# Patient Record
Sex: Male | Born: 1950 | Race: White | Hispanic: No | Smoking: Former smoker
Health system: Southern US, Community
[De-identification: ages and names within clinical notes are randomized; demographics above are authoritative.]

## PROBLEM LIST (undated history)

## (undated) DIAGNOSIS — K5900 Constipation, unspecified: Secondary | ICD-10-CM

## (undated) DIAGNOSIS — K7010 Alcoholic hepatitis without ascites: Secondary | ICD-10-CM

## (undated) DIAGNOSIS — G4700 Insomnia, unspecified: Secondary | ICD-10-CM

## (undated) DIAGNOSIS — G8929 Other chronic pain: Secondary | ICD-10-CM

## (undated) DIAGNOSIS — K219 Gastro-esophageal reflux disease without esophagitis: Secondary | ICD-10-CM

## (undated) DIAGNOSIS — E7800 Pure hypercholesterolemia, unspecified: Secondary | ICD-10-CM

## (undated) DIAGNOSIS — B009 Herpesviral infection, unspecified: Secondary | ICD-10-CM

## (undated) DIAGNOSIS — N400 Enlarged prostate without lower urinary tract symptoms: Secondary | ICD-10-CM

## (undated) DIAGNOSIS — F330 Major depressive disorder, recurrent, mild: Secondary | ICD-10-CM

## (undated) DIAGNOSIS — F32A Depression, unspecified: Secondary | ICD-10-CM

## (undated) DIAGNOSIS — R45851 Suicidal ideations: Secondary | ICD-10-CM

## (undated) DIAGNOSIS — K5790 Diverticulosis of intestine, part unspecified, without perforation or abscess without bleeding: Secondary | ICD-10-CM

## (undated) DIAGNOSIS — R55 Syncope and collapse: Secondary | ICD-10-CM

## (undated) DIAGNOSIS — K449 Diaphragmatic hernia without obstruction or gangrene: Secondary | ICD-10-CM

## (undated) DIAGNOSIS — R7989 Other specified abnormal findings of blood chemistry: Secondary | ICD-10-CM

## (undated) DIAGNOSIS — F419 Anxiety disorder, unspecified: Secondary | ICD-10-CM

## (undated) DIAGNOSIS — R9389 Abnormal findings on diagnostic imaging of other specified body structures: Secondary | ICD-10-CM

## (undated) HISTORY — DX: Insomnia, unspecified: G47.00

## (undated) HISTORY — DX: Diverticulosis of intestine, part unspecified, without perforation or abscess without bleeding: K57.90

## (undated) HISTORY — DX: Herpesviral infection, unspecified: B00.9

## (undated) HISTORY — PX: TONSILLECTOMY: SUR1361

## (undated) HISTORY — DX: Constipation, unspecified: K59.00

## (undated) HISTORY — DX: Major depressive disorder, recurrent, mild: F33.0

## (undated) HISTORY — PX: NISSEN FUNDOPLICATION: SHX2091

## (undated) HISTORY — PX: BACK SURGERY: SHX140

## (undated) HISTORY — DX: Abnormal findings on diagnostic imaging of other specified body structures: R93.89

## (undated) HISTORY — DX: Depression, unspecified: F32.A

## (undated) HISTORY — DX: Alcoholic hepatitis without ascites: K70.10

## (undated) HISTORY — DX: Anxiety disorder, unspecified: F41.9

## (undated) HISTORY — DX: Diaphragmatic hernia without obstruction or gangrene: K44.9

## (undated) HISTORY — DX: Benign prostatic hyperplasia without lower urinary tract symptoms: N40.0

## (undated) HISTORY — PX: LUMBAR DISC SURGERY: SHX700

## (undated) HISTORY — DX: Other chronic pain: G89.29

## (undated) HISTORY — DX: Suicidal ideations: R45.851

---

## 1999-08-21 ENCOUNTER — Encounter: Admission: RE | Admit: 1999-08-21 | Discharge: 1999-08-21 | Payer: Self-pay | Admitting: Family Medicine

## 1999-08-21 ENCOUNTER — Encounter: Payer: Self-pay | Admitting: Family Medicine

## 2010-08-09 ENCOUNTER — Emergency Department (HOSPITAL_COMMUNITY)
Admission: EM | Admit: 2010-08-09 | Discharge: 2010-08-09 | Payer: Self-pay | Source: Home / Self Care | Admitting: Emergency Medicine

## 2010-08-17 ENCOUNTER — Emergency Department (HOSPITAL_COMMUNITY)
Admission: EM | Admit: 2010-08-17 | Discharge: 2010-08-18 | Payer: Self-pay | Source: Home / Self Care | Admitting: Emergency Medicine

## 2010-08-17 LAB — AMMONIA: Ammonia: 20 umol/L (ref 11–35)

## 2010-08-17 LAB — COMPREHENSIVE METABOLIC PANEL
ALT: 39 U/L (ref 0–53)
AST: 35 U/L (ref 0–37)
Albumin: 4.1 g/dL (ref 3.5–5.2)
Alkaline Phosphatase: 131 U/L — ABNORMAL HIGH (ref 39–117)
BUN: 15 mg/dL (ref 6–23)
CO2: 25 mEq/L (ref 19–32)
Calcium: 9.4 mg/dL (ref 8.4–10.5)
Chloride: 103 mEq/L (ref 96–112)
Creatinine, Ser: 1.49 mg/dL (ref 0.4–1.5)
GFR calc Af Amer: 58 mL/min — ABNORMAL LOW (ref >60)
GFR calc non Af Amer: 48 mL/min — ABNORMAL LOW (ref >60)
Glucose, Bld: 155 mg/dL — ABNORMAL HIGH (ref 70–99)
Potassium: 3.7 mEq/L (ref 3.5–5.1)
Sodium: 138 mEq/L (ref 135–145)
Total Bilirubin: 0.6 mg/dL (ref 0.3–1.2)
Total Protein: 7.5 g/dL (ref 6.0–8.3)

## 2010-08-17 LAB — CBC
HCT: 44.3 % (ref 39.0–52.0)
Hemoglobin: 15.6 g/dL (ref 13.0–17.0)
MCH: 31.1 pg (ref 26.0–34.0)
MCHC: 35.2 g/dL (ref 30.0–36.0)
MCV: 88.2 fL (ref 78.0–100.0)
Platelets: 166 10*3/uL (ref 150–400)
RBC: 5.02 MIL/uL (ref 4.22–5.81)
RDW: 13.7 % (ref 11.5–15.5)
WBC: 6.5 10*3/uL (ref 4.0–10.5)

## 2010-08-17 LAB — DIFFERENTIAL
Basophils Absolute: 0 10*3/uL (ref 0.0–0.1)
Basophils Relative: 0 % (ref 0–1)
Eosinophils Absolute: 0 10*3/uL (ref 0.0–0.7)
Eosinophils Relative: 0 % (ref 0–5)
Lymphocytes Relative: 14 % (ref 12–46)
Lymphs Abs: 0.9 10*3/uL (ref 0.7–4.0)
Monocytes Absolute: 0.4 10*3/uL (ref 0.1–1.0)
Monocytes Relative: 6 % (ref 3–12)
Neutro Abs: 5.1 10*3/uL (ref 1.7–7.7)
Neutrophils Relative %: 79 % — ABNORMAL HIGH (ref 43–77)

## 2010-08-17 LAB — GLUCOSE, CAPILLARY: Glucose-Capillary: 159 mg/dL — ABNORMAL HIGH (ref 70–99)

## 2010-08-17 LAB — ETHANOL: Alcohol, Ethyl (B): 100 mg/dL — ABNORMAL HIGH (ref 0–10)

## 2010-08-17 LAB — PROTIME-INR
INR: 1.01 (ref 0.00–1.49)
Prothrombin Time: 13.5 seconds (ref 11.6–15.2)

## 2010-08-18 LAB — RAPID URINE DRUG SCREEN, HOSP PERFORMED
Amphetamines: NOT DETECTED
Barbiturates: NOT DETECTED
Benzodiazepines: NOT DETECTED
Cocaine: NOT DETECTED
Opiates: NOT DETECTED
Tetrahydrocannabinol: POSITIVE — AB

## 2010-10-23 LAB — DIFFERENTIAL
Basophils Absolute: 0 10*3/uL (ref 0.0–0.1)
Basophils Relative: 1 % (ref 0–1)
Eosinophils Absolute: 0.2 10*3/uL (ref 0.0–0.7)
Eosinophils Relative: 3 % (ref 0–5)
Lymphocytes Relative: 18 % (ref 12–46)
Lymphs Abs: 1.1 10*3/uL (ref 0.7–4.0)
Monocytes Absolute: 0.7 10*3/uL (ref 0.1–1.0)
Monocytes Relative: 11 % (ref 3–12)
Neutro Abs: 4.3 10*3/uL (ref 1.7–7.7)
Neutrophils Relative %: 68 % (ref 43–77)

## 2010-10-23 LAB — CBC
HCT: 42.5 % (ref 39.0–52.0)
Hemoglobin: 15.2 g/dL (ref 13.0–17.0)
MCH: 31.1 pg (ref 26.0–34.0)
MCHC: 35.8 g/dL (ref 30.0–36.0)
MCV: 87.1 fL (ref 78.0–100.0)
Platelets: 162 10*3/uL (ref 150–400)
RBC: 4.88 MIL/uL (ref 4.22–5.81)
RDW: 13.4 % (ref 11.5–15.5)
WBC: 6.3 10*3/uL (ref 4.0–10.5)

## 2010-10-23 LAB — POCT I-STAT, CHEM 8
BUN: 26 mg/dL — ABNORMAL HIGH (ref 6–23)
Calcium, Ion: 1.15 mmol/L (ref 1.12–1.32)
Chloride: 106 mEq/L (ref 96–112)
Creatinine, Ser: 1.6 mg/dL — ABNORMAL HIGH (ref 0.4–1.5)
Glucose, Bld: 199 mg/dL — ABNORMAL HIGH (ref 70–99)
HCT: 44 % (ref 39.0–52.0)
Hemoglobin: 15 g/dL (ref 13.0–17.0)
Potassium: 3.9 mEq/L (ref 3.5–5.1)
Sodium: 137 mEq/L (ref 135–145)
TCO2: 22 mmol/L (ref 0–100)

## 2010-10-23 LAB — POCT CARDIAC MARKERS
CKMB, poc: <1 ng/mL — ABNORMAL LOW (ref 1.0–8.0)
CKMB, poc: <1 ng/mL — ABNORMAL LOW (ref 1.0–8.0)
Myoglobin, poc: 63.2 ng/mL (ref 12–200)
Myoglobin, poc: 88.7 ng/mL (ref 12–200)
Troponin i, poc: <0.05 ng/mL (ref 0.00–0.09)
Troponin i, poc: <0.05 ng/mL (ref 0.00–0.09)

## 2010-10-23 LAB — D-DIMER, QUANTITATIVE: D-Dimer, Quant: 0.27 ug/mL-FEU (ref 0.00–0.48)

## 2014-12-18 ENCOUNTER — Encounter (HOSPITAL_COMMUNITY): Payer: Self-pay | Admitting: Emergency Medicine

## 2014-12-18 ENCOUNTER — Emergency Department (HOSPITAL_COMMUNITY)
Admission: EM | Admit: 2014-12-18 | Discharge: 2014-12-18 | Disposition: A | Discharge status: Home / Self Care | Payer: Self-pay | Attending: Emergency Medicine | Admitting: Emergency Medicine

## 2014-12-18 ENCOUNTER — Emergency Department (HOSPITAL_COMMUNITY): Payer: Self-pay

## 2014-12-18 DIAGNOSIS — E780 Pure hypercholesterolemia: Secondary | ICD-10-CM | POA: Insufficient documentation

## 2014-12-18 DIAGNOSIS — Z79899 Other long term (current) drug therapy: Secondary | ICD-10-CM | POA: Insufficient documentation

## 2014-12-18 DIAGNOSIS — R55 Syncope and collapse: Secondary | ICD-10-CM | POA: Insufficient documentation

## 2014-12-18 DIAGNOSIS — Z7982 Long term (current) use of aspirin: Secondary | ICD-10-CM | POA: Insufficient documentation

## 2014-12-18 DIAGNOSIS — K219 Gastro-esophageal reflux disease without esophagitis: Secondary | ICD-10-CM | POA: Insufficient documentation

## 2014-12-18 HISTORY — DX: Pure hypercholesterolemia, unspecified: E78.00

## 2014-12-18 HISTORY — DX: Other specified abnormal findings of blood chemistry: R79.89

## 2014-12-18 HISTORY — DX: Syncope and collapse: R55

## 2014-12-18 HISTORY — DX: Gastro-esophageal reflux disease without esophagitis: K21.9

## 2014-12-18 LAB — I-STAT CHEM 8, ED
BUN: 13 mg/dL (ref 6–20)
Calcium, Ion: 1.16 mmol/L (ref 1.13–1.30)
Chloride: 100 mmol/L — ABNORMAL LOW (ref 101–111)
Creatinine, Ser: 1.2 mg/dL (ref 0.61–1.24)
Glucose, Bld: 105 mg/dL — ABNORMAL HIGH (ref 70–99)
HCT: 43 % (ref 39.0–52.0)
Hemoglobin: 14.6 g/dL (ref 13.0–17.0)
Potassium: 4.3 mmol/L (ref 3.5–5.1)
Sodium: 135 mmol/L (ref 135–145)
TCO2: 20 mmol/L (ref 0–100)

## 2014-12-18 LAB — CBC
HCT: 40.2 % (ref 39.0–52.0)
Hemoglobin: 13.5 g/dL (ref 13.0–17.0)
MCH: 30.4 pg (ref 26.0–34.0)
MCHC: 33.6 g/dL (ref 30.0–36.0)
MCV: 90.5 fL (ref 78.0–100.0)
Platelets: 200 10*3/uL (ref 150–400)
RBC: 4.44 MIL/uL (ref 4.22–5.81)
RDW: 14.1 % (ref 11.5–15.5)
WBC: 7.6 10*3/uL (ref 4.0–10.5)

## 2014-12-18 LAB — I-STAT TROPONIN, ED: Troponin i, poc: 0 ng/mL (ref 0.00–0.08)

## 2014-12-18 NOTE — ED Provider Notes (Signed)
CSN: [id number]     Arrival date & time 12/18/14  1136 History   First MD Initiated Contact with Patient 12/18/14 1145     Chief Complaint  Patient presents with  . Loss of Consciousness     (Consider location/radiation/quality/duration/timing/severity/associated sxs/prior Treatment) HPI Comments: The patient is a 64 year old male, he works as a Music therapistcarpenter, he has had 3 distinct episodes over the last couple of years where he has had a change in his mental status, the first occurred when he was at work, he turned his head to the side to talk to a customer and she reported to him that he appeared to be sleeping and not paying attention to her. He had a workup in the emergency department, he is unsure what tests he had, he was released with no acute diagnosis. The second episode occurred when he was talking to his significant other, she reported that his eyes rolled back, his hands shook for several minutes and then resolve spontaneously. He was not evaluated for this episode, he presents today after having a syncopal event that was reported by customer while he was on the job, he stood up very quickly from a kneeling position, she reports that he fell over and went to the ground passing out, he does not remember this, he does not have a headache, he is persistently lightheaded but states "I smoke a lot of pot". He denies chest pain, palpitations, shortness of breath, headache, blurred vision, numbness, weakness, diarrhea, dysuria. He does drink a lot of water to stay hydrated at work, he does not usually have positional changes causing lightheadedness. He did eat breakfast prior to this occurring this morning. He denies any alcohol or other drugs.  Patient is a 64 y.o. male presenting with syncope. The history is provided by the patient.  Loss of Consciousness   Past Medical History  Diagnosis Date  . Syncope   . High cholesterol   . Low testosterone   . GERD (gastroesophageal reflux disease)     Past Surgical History  Procedure Laterality Date  . Nissen fundoplication     History reviewed. No pertinent family history. History  Substance Use Topics  . Smoking status: Never Smoker   . Smokeless tobacco: Not on file  . Alcohol Use: No    Review of Systems  Cardiovascular: Positive for syncope.  All other systems reviewed and are negative.     Allergies  Review of patient's allergies indicates no known allergies.  Home Medications   Prior to Admission medications   Medication Sig Start Date End Date Taking? Authorizing Provider  aspirin EC 81 MG tablet Take 81 mg by mouth daily.   Yes Historical Provider, MD  b complex vitamins tablet Take 1 tablet by mouth daily.   Yes Historical Provider, MD  B Complex-C (SUPER B COMPLEX PO) Take by mouth.   Yes Historical Provider, MD  Cholecalciferol (VITAMIN D PO) Take 1 capsule by mouth daily.   Yes Historical Provider, MD  cyclobenzaprine (FLEXERIL) 10 MG tablet Take 10 mg by mouth 2 (two) times daily as needed for muscle spasms.   Yes Historical Provider, MD  finasteride (PROSCAR) 5 MG tablet Take 5 mg by mouth daily.   Yes Historical Provider, MD  folic acid (FOLVITE) 1 MG tablet Take 1 mg by mouth daily.   Yes Historical Provider, MD  MAGNESIUM PO Take 1 tablet by mouth daily.   Yes Historical Provider, MD  mirtazapine (REMERON) 30 MG tablet Take 30 mg  by mouth at bedtime.   Yes Historical Provider, MD  omeprazole (PRILOSEC) 20 MG capsule Take 40 mg by mouth daily.   Yes Historical Provider, MD  simvastatin (ZOCOR) 40 MG tablet Take 40 mg by mouth daily.   Yes Historical Provider, MD  valACYclovir (VALTREX) 500 MG tablet Take 500 mg by mouth 2 (two) times daily.   Yes Historical Provider, MD  Venlafaxine HCl 225 MG TB24 Take 225 mg by mouth at bedtime.   Yes Historical Provider, MD   BP 118/67 mmHg  Pulse 72  Temp(Src) 98 F (36.7 C) (Oral)  Resp 16  SpO2 100% Physical Exam  Constitutional: He appears well-developed  and well-nourished. No distress.  HENT:  Head: Normocephalic and atraumatic.  Mouth/Throat: Oropharynx is clear and moist. No oropharyngeal exudate.  Eyes: Conjunctivae and EOM are normal. Pupils are equal, round, and reactive to light. Right eye exhibits no discharge. Left eye exhibits no discharge. No scleral icterus.  Neck: Normal range of motion. Neck supple. No JVD present. No thyromegaly present.  Cardiovascular: Normal rate, regular rhythm, normal heart sounds and intact distal pulses.  Exam reveals no gallop and no friction rub.   No murmur heard. Pulmonary/Chest: Effort normal and breath sounds normal. No respiratory distress. He has no wheezes. He has no rales.  Abdominal: Soft. Bowel sounds are normal. He exhibits no distension and no mass. There is no tenderness.  Musculoskeletal: Normal range of motion. He exhibits no edema or tenderness.  Lymphadenopathy:    He has no cervical adenopathy.  Neurological: He is alert. Coordination normal.  Speech is clear, cranial nerves III through XII are intact, memory is intact, strength is normal in all 4 extremities including grips, sensation is intact to light touch and pinprick in all 4 extremities. Coordination as tested by finger-nose-finger is normal, no limb ataxia. Normal gait, normal reflexes at the patellar tendons bilaterally  Skin: Skin is warm and dry. No rash noted. No erythema.  Psychiatric: He has a normal mood and affect. His behavior is normal.  Nursing note and vitals reviewed.   ED Course  Procedures (including critical care time) Labs Review Labs Reviewed  I-STAT CHEM 8, ED - Abnormal; Notable for the following:    Chloride 100 (*)    Glucose, Bld 105 (*)    All other components within normal limits  CBC  I-STAT TROPOININ, ED    Imaging Review Ct Head Wo Contrast  12/18/2014   CLINICAL DATA:  Syncopal episode today after standing up from a seated position.  EXAM: CT HEAD WITHOUT CONTRAST  TECHNIQUE: Contiguous  axial images were obtained from the base of the skull through the vertex without intravenous contrast.  COMPARISON:  08/17/2010.  FINDINGS: Normal appearing cerebral hemispheres and posterior fossa structures. Normal size and position of the ventricles. No skull fracture, intracranial hemorrhage, mass lesion, CT evidence of acute infarction or paranasal sinus air-fluid levels.  IMPRESSION: Normal examination.   Electronically Signed   By: Beckie SaltsSteven  Reid M.D.   On: 12/18/2014 13:12     EKG Interpretation   Date/Time:  Saturday Dec 18 2014 11:42:00 EDT Ventricular Rate:  79 PR Interval:  147 QRS Duration: 93 QT Interval:  367 QTC Calculation: 421 R Axis:   -10 Text Interpretation:  Sinus rhythm Normal ECG since last tracing no  significant change Confirmed by Jobani Sabado  MD, Nicolis Boody ([id number]) on 12/18/2014  11:45:46 AM      MDM   Final diagnoses:  Syncope    The patient's  exam at this time is totally normal as is his EKG, his positional change suggests an orthostatic change, I am somewhat concerned that the first 2 episodes may have been more seizure-like activity though today's did not sound the same. I will obtain a CT scan of the brain, labs to rule out hyponatremia, electrolyte abnormalities, dehydration, anemia, reevaluate. At this time the patient appears at his baseline and has no complaints.  CT and labs normal - VS normal - pt stable for d/c, recommended outpt neuro f/u.    Eber Hong, MD 12/18/14 [phone]

## 2014-12-18 NOTE — ED Notes (Signed)
Attempted to obtain orthostatic vitals, blood pressure on monitor not working at this time

## 2014-12-18 NOTE — ED Notes (Signed)
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  Pt states that he got up from a seated position, turned to walk out the door and does not remember what happened next.  Pt states that this has happened before but did not go to the doctor.  Denies any head injury.  Pt is A&O x 4.

## 2014-12-18 NOTE — Discharge Instructions (Signed)
Your testing has been normal - you may follow up with your doctor for recheck as needed - you should follow up with a neurologist for further evaluation if you have anymore of these abnormal fainting or shaking spells.  See the follow up information above.  Please call your doctor for a followup appointment within 24-48 hours. When you talk to your doctor please let them know that you were seen in the emergency department and have them acquire all of your records so that they can discuss the findings with you and formulate a treatment plan to fully care for your new and ongoing problems.

## 2016-02-16 ENCOUNTER — Institutional Professional Consult (permissible substitution): Payer: Non-veteran care | Admitting: Pulmonary Disease

## 2016-04-20 ENCOUNTER — Institutional Professional Consult (permissible substitution): Payer: Non-veteran care | Admitting: Pulmonary Disease

## 2017-11-27 DIAGNOSIS — N644 Mastodynia: Secondary | ICD-10-CM | POA: Diagnosis not present

## 2018-08-04 DIAGNOSIS — Z570 Occupational exposure to noise: Secondary | ICD-10-CM | POA: Diagnosis not present

## 2018-08-04 DIAGNOSIS — H903 Sensorineural hearing loss, bilateral: Secondary | ICD-10-CM | POA: Diagnosis not present

## 2018-08-04 DIAGNOSIS — H9313 Tinnitus, bilateral: Secondary | ICD-10-CM | POA: Diagnosis not present

## 2018-08-04 DIAGNOSIS — Z822 Family history of deafness and hearing loss: Secondary | ICD-10-CM | POA: Diagnosis not present

## 2019-03-06 DIAGNOSIS — H2513 Age-related nuclear cataract, bilateral: Secondary | ICD-10-CM | POA: Diagnosis not present

## 2019-03-06 DIAGNOSIS — H3589 Other specified retinal disorders: Secondary | ICD-10-CM | POA: Diagnosis not present

## 2019-04-10 DIAGNOSIS — H2511 Age-related nuclear cataract, right eye: Secondary | ICD-10-CM | POA: Diagnosis not present

## 2019-05-01 DIAGNOSIS — H25811 Combined forms of age-related cataract, right eye: Secondary | ICD-10-CM | POA: Diagnosis not present

## 2019-05-01 DIAGNOSIS — H2511 Age-related nuclear cataract, right eye: Secondary | ICD-10-CM | POA: Diagnosis not present

## 2019-05-08 DIAGNOSIS — H35351 Cystoid macular degeneration, right eye: Secondary | ICD-10-CM | POA: Diagnosis not present

## 2019-10-18 ENCOUNTER — Ambulatory Visit: Payer: Medicare HMO | Attending: Internal Medicine

## 2019-10-18 DIAGNOSIS — Z23 Encounter for immunization: Secondary | ICD-10-CM | POA: Insufficient documentation

## 2019-10-18 NOTE — Progress Notes (Signed)
   Covid-19 Vaccination Clinic  Name:  Rohaan Durnil    MRN: [id number] DOB: [birth year]  10/18/2019  Mr. Prust was observed post Covid-19 immunization for 15 minutes without incident. He was provided with Vaccine Information Sheet and instruction to access the V-Safe system.   Mr. Auxier was instructed to call 911 with any severe reactions post vaccine: Marland Kitchen Difficulty breathing  . Swelling of face and throat  . A fast heartbeat  . A bad rash all over body  . Dizziness and weakness   Immunizations Administered    Name Date Dose VIS Date Route   Pfizer COVID-19 Vaccine 10/18/2019  8:07 AM 0.3 mL 07/24/2019 Intramuscular   Manufacturer: [employer], [employer]   Lot: [id number]   NDC: [address]-8

## 2019-11-17 ENCOUNTER — Ambulatory Visit: Payer: Non-veteran care | Attending: Internal Medicine

## 2019-11-17 DIAGNOSIS — Z23 Encounter for immunization: Secondary | ICD-10-CM

## 2019-11-17 NOTE — Progress Notes (Signed)
   Covid-19 Vaccination Clinic  Name:  Martin Suarez    MRN: [id number] DOB: [birth year]  11/17/2019  [name] was observed post Covid-19 immunization for 15 minutes without incident. He was provided with Vaccine Information Sheet and instruction to access the V-Safe system.   [name] was instructed to call 911 with any severe reactions post vaccine: Marland Kitchen Difficulty breathing  . Swelling of face and throat  . A fast heartbeat  . A bad rash all over body  . Dizziness and weakness   Immunizations Administered    Name Date Dose VIS Date Route   Pfizer COVID-19 Vaccine 11/17/2019 10:12 AM 0.3 mL 07/24/2019 Intramuscular   Manufacturer: [employer], [employer]   Lot: [id number]   NDC: [address]-2

## 2020-01-13 ENCOUNTER — Encounter (HOSPITAL_COMMUNITY): Payer: Self-pay | Admitting: Emergency Medicine

## 2020-01-13 ENCOUNTER — Other Ambulatory Visit: Payer: Self-pay

## 2020-01-13 ENCOUNTER — Inpatient Hospital Stay (HOSPITAL_COMMUNITY)
Admission: EM | Admit: 2020-01-13 | Discharge: 2020-01-18 | DRG: 948 | Disposition: A | Discharge status: Home / Self Care | Payer: No Typology Code available for payment source | Attending: Neurosurgery | Admitting: Neurosurgery

## 2020-01-13 DIAGNOSIS — M79605 Pain in left leg: Secondary | ICD-10-CM | POA: Diagnosis present

## 2020-01-13 DIAGNOSIS — R29898 Other symptoms and signs involving the musculoskeletal system: Secondary | ICD-10-CM

## 2020-01-13 DIAGNOSIS — Z888 Allergy status to other drugs, medicaments and biological substances status: Secondary | ICD-10-CM | POA: Diagnosis not present

## 2020-01-13 DIAGNOSIS — E7800 Pure hypercholesterolemia, unspecified: Secondary | ICD-10-CM | POA: Diagnosis present

## 2020-01-13 DIAGNOSIS — R55 Syncope and collapse: Secondary | ICD-10-CM | POA: Diagnosis present

## 2020-01-13 DIAGNOSIS — M79604 Pain in right leg: Secondary | ICD-10-CM | POA: Diagnosis present

## 2020-01-13 DIAGNOSIS — K219 Gastro-esophageal reflux disease without esophagitis: Secondary | ICD-10-CM | POA: Diagnosis present

## 2020-01-13 DIAGNOSIS — M5126 Other intervertebral disc displacement, lumbar region: Secondary | ICD-10-CM | POA: Diagnosis present

## 2020-01-13 DIAGNOSIS — Z20822 Contact with and (suspected) exposure to covid-19: Secondary | ICD-10-CM | POA: Diagnosis present

## 2020-01-13 DIAGNOSIS — M79606 Pain in leg, unspecified: Secondary | ICD-10-CM | POA: Diagnosis present

## 2020-01-13 DIAGNOSIS — Z7982 Long term (current) use of aspirin: Secondary | ICD-10-CM | POA: Diagnosis not present

## 2020-01-13 DIAGNOSIS — Z79899 Other long term (current) drug therapy: Secondary | ICD-10-CM | POA: Diagnosis not present

## 2020-01-13 DIAGNOSIS — G8918 Other acute postprocedural pain: Secondary | ICD-10-CM | POA: Diagnosis not present

## 2020-01-13 DIAGNOSIS — R531 Weakness: Secondary | ICD-10-CM | POA: Diagnosis present

## 2020-01-13 LAB — CBC WITH DIFFERENTIAL/PLATELET
Abs Immature Granulocytes: 0.13 10*3/uL — ABNORMAL HIGH (ref 0.00–0.07)
Abs Immature Granulocytes: 0.11 10*3/uL — ABNORMAL HIGH (ref 0.00–0.07)
Basophils Absolute: 0 10*3/uL (ref 0.0–0.1)
Basophils Absolute: 0 10*3/uL (ref 0.0–0.1)
Basophils Relative: 0 %
Basophils Relative: 0 %
Eosinophils Absolute: 0.1 10*3/uL (ref 0.0–0.5)
Eosinophils Absolute: 0.1 10*3/uL (ref 0.0–0.5)
Eosinophils Relative: 0 %
Eosinophils Relative: 1 %
HCT: 39.3 % (ref 39.0–52.0)
HCT: 35.4 % — ABNORMAL LOW (ref 39.0–52.0)
Hemoglobin: 13.2 g/dL (ref 13.0–17.0)
Hemoglobin: 11.8 g/dL — ABNORMAL LOW (ref 13.0–17.0)
Immature Granulocytes: 1 %
Immature Granulocytes: 1 %
Lymphocytes Relative: 12 %
Lymphocytes Relative: 15 %
Lymphs Abs: 2.1 10*3/uL (ref 0.7–4.0)
Lymphs Abs: 2.2 10*3/uL (ref 0.7–4.0)
MCH: 32.6 pg (ref 26.0–34.0)
MCH: 32.5 pg (ref 26.0–34.0)
MCHC: 33.6 g/dL (ref 30.0–36.0)
MCHC: 33.3 g/dL (ref 30.0–36.0)
MCV: 97 fL (ref 80.0–100.0)
MCV: 97.5 fL (ref 80.0–100.0)
Monocytes Absolute: 1 10*3/uL (ref 0.1–1.0)
Monocytes Absolute: 0.8 10*3/uL (ref 0.1–1.0)
Monocytes Relative: 5 %
Monocytes Relative: 6 %
Neutro Abs: 14.6 10*3/uL — ABNORMAL HIGH (ref 1.7–7.7)
Neutro Abs: 11.4 10*3/uL — ABNORMAL HIGH (ref 1.7–7.7)
Neutrophils Relative %: 82 %
Neutrophils Relative %: 77 %
Platelets: 436 10*3/uL — ABNORMAL HIGH (ref 150–400)
Platelets: 392 10*3/uL (ref 150–400)
RBC: 4.05 MIL/uL — ABNORMAL LOW (ref 4.22–5.81)
RBC: 3.63 MIL/uL — ABNORMAL LOW (ref 4.22–5.81)
RDW: 12.4 % (ref 11.5–15.5)
RDW: 12.4 % (ref 11.5–15.5)
WBC: 18 10*3/uL — ABNORMAL HIGH (ref 4.0–10.5)
WBC: 14.7 10*3/uL — ABNORMAL HIGH (ref 4.0–10.5)
nRBC: 0 % (ref 0.0–0.2)
nRBC: 0 % (ref 0.0–0.2)

## 2020-01-13 LAB — COMPREHENSIVE METABOLIC PANEL
ALT: 61 U/L — ABNORMAL HIGH (ref 0–44)
AST: 30 U/L (ref 15–41)
Albumin: 3.9 g/dL (ref 3.5–5.0)
Alkaline Phosphatase: 93 U/L (ref 38–126)
Anion gap: 13 (ref 5–15)
BUN: 21 mg/dL (ref 8–23)
CO2: 23 mmol/L (ref 22–32)
Calcium: 9.5 mg/dL (ref 8.9–10.3)
Chloride: 96 mmol/L — ABNORMAL LOW (ref 98–111)
Creatinine, Ser: 1.25 mg/dL — ABNORMAL HIGH (ref 0.61–1.24)
GFR calc Af Amer: >60 mL/min (ref >60)
GFR calc non Af Amer: 58 mL/min — ABNORMAL LOW (ref >60)
Glucose, Bld: 127 mg/dL — ABNORMAL HIGH (ref 70–99)
Potassium: 4 mmol/L (ref 3.5–5.1)
Sodium: 132 mmol/L — ABNORMAL LOW (ref 135–145)
Total Bilirubin: 0.8 mg/dL (ref 0.3–1.2)
Total Protein: 7.3 g/dL (ref 6.5–8.1)

## 2020-01-13 LAB — CREATININE, SERUM
Creatinine, Ser: 1.14 mg/dL (ref 0.61–1.24)
GFR calc Af Amer: >60 mL/min (ref >60)
GFR calc non Af Amer: >60 mL/min (ref >60)

## 2020-01-13 LAB — PROTIME-INR
INR: 1.1 (ref 0.8–1.2)
Prothrombin Time: 13.7 seconds (ref 11.4–15.2)

## 2020-01-13 LAB — URINALYSIS, ROUTINE W REFLEX MICROSCOPIC
Bilirubin Urine: NEGATIVE
Glucose, UA: NEGATIVE mg/dL
Ketones, ur: NEGATIVE mg/dL
Nitrite: NEGATIVE
Protein, ur: NEGATIVE mg/dL
Specific Gravity, Urine: 1.008 (ref 1.005–1.030)
WBC, UA: >50 WBC/hpf — ABNORMAL HIGH (ref 0–5)
pH: 6 (ref 5.0–8.0)

## 2020-01-13 LAB — HIV ANTIBODY (ROUTINE TESTING W REFLEX): HIV Screen 4th Generation wRfx: NONREACTIVE

## 2020-01-13 LAB — SARS CORONAVIRUS 2 BY RT PCR (HOSPITAL ORDER, PERFORMED IN ~~LOC~~ HOSPITAL LAB): SARS Coronavirus 2: NEGATIVE

## 2020-01-13 LAB — APTT: aPTT: 28 seconds (ref 24–36)

## 2020-01-13 MED ORDER — FOLIC ACID 1 MG PO TABS
1.0000 mg | ORAL_TABLET | Freq: Every day | ORAL | Status: DC
  Administered 2020-01-14 – 2020-01-18 (×5): 1 mg via ORAL
  Filled 2020-01-13 (×5): qty 1

## 2020-01-13 MED ORDER — SODIUM CHLORIDE 0.9 % IV SOLN: 250.0000 mL | INTRAVENOUS | Status: DC | PRN

## 2020-01-13 MED ORDER — ASPIRIN EC 81 MG PO TBEC
81.0000 mg | DELAYED_RELEASE_TABLET | Freq: Every day | ORAL | Status: DC
  Administered 2020-01-14 – 2020-01-15 (×2): 81 mg via ORAL
  Filled 2020-01-13 (×5): qty 1

## 2020-01-13 MED ORDER — FINASTERIDE 5 MG PO TABS
5.0000 mg | ORAL_TABLET | Freq: Every day | ORAL | Status: DC
  Administered 2020-01-13 – 2020-01-18 (×6): 5 mg via ORAL
  Filled 2020-01-13 (×6): qty 1

## 2020-01-13 MED ORDER — POTASSIUM CHLORIDE IN NACL 20-0.9 MEQ/L-% IV SOLN
INTRAVENOUS | Status: DC
  Filled 2020-01-13 (×3): qty 1000

## 2020-01-13 MED ORDER — VENLAFAXINE HCL ER 75 MG PO CP24
225.0000 mg | ORAL_CAPSULE | Freq: Every day | ORAL | Status: DC
  Administered 2020-01-13 – 2020-01-17 (×5): 225 mg via ORAL
  Filled 2020-01-13 (×5): qty 3

## 2020-01-13 MED ORDER — ACETAMINOPHEN 650 MG RE SUPP: 650.0000 mg | Freq: Four times a day (QID) | RECTAL | Status: DC | PRN

## 2020-01-13 MED ORDER — PANTOPRAZOLE SODIUM 40 MG PO TBEC
40.0000 mg | DELAYED_RELEASE_TABLET | Freq: Every day | ORAL | Status: DC
  Administered 2020-01-13 – 2020-01-18 (×6): 40 mg via ORAL
  Filled 2020-01-13 (×6): qty 1

## 2020-01-13 MED ORDER — HYDROMORPHONE HCL 1 MG/ML IJ SOLN
1.0000 mg | Freq: Once | INTRAMUSCULAR | Status: AC
  Administered 2020-01-13: 1 mg via INTRAVENOUS
  Filled 2020-01-13: qty 1

## 2020-01-13 MED ORDER — OXYCODONE HCL 5 MG PO TABS
5.0000 mg | ORAL_TABLET | ORAL | Status: DC | PRN
  Administered 2020-01-14 (×3): 10 mg via ORAL
  Filled 2020-01-13 (×3): qty 2

## 2020-01-13 MED ORDER — SIMVASTATIN 20 MG PO TABS
40.0000 mg | ORAL_TABLET | Freq: Every day | ORAL | Status: DC
  Administered 2020-01-13 – 2020-01-18 (×6): 40 mg via ORAL
  Filled 2020-01-13 (×6): qty 2

## 2020-01-13 MED ORDER — DIAZEPAM 2 MG PO TABS
5.0000 mg | ORAL_TABLET | Freq: Four times a day (QID) | ORAL | Status: DC | PRN
  Administered 2020-01-13: 5 mg via ORAL
  Filled 2020-01-13: qty 3

## 2020-01-13 MED ORDER — ASPIRIN EC 81 MG PO TBEC: 81.0000 mg | DELAYED_RELEASE_TABLET | Freq: Every day | ORAL | Status: DC

## 2020-01-13 MED ORDER — OXYCODONE HCL ER 15 MG PO T12A
15.0000 mg | EXTENDED_RELEASE_TABLET | Freq: Two times a day (BID) | ORAL | Status: DC
  Administered 2020-01-13 – 2020-01-18 (×11): 15 mg via ORAL
  Filled 2020-01-13 (×11): qty 1

## 2020-01-13 MED ORDER — SODIUM CHLORIDE 0.9% FLUSH
3.0000 mL | Freq: Two times a day (BID) | INTRAVENOUS | Status: DC
  Administered 2020-01-14 – 2020-01-17 (×7): 3 mL via INTRAVENOUS

## 2020-01-13 MED ORDER — ONDANSETRON HCL 4 MG PO TABS: 4.0000 mg | ORAL_TABLET | Freq: Four times a day (QID) | ORAL | Status: DC | PRN

## 2020-01-13 MED ORDER — ACETAMINOPHEN 325 MG PO TABS: 650.0000 mg | ORAL_TABLET | Freq: Four times a day (QID) | ORAL | Status: DC | PRN

## 2020-01-13 MED ORDER — MAGNESIUM 200 MG PO TABS: 1.0000 | ORAL_TABLET | Freq: Every day | ORAL | Status: DC

## 2020-01-13 MED ORDER — MIRTAZAPINE 15 MG PO TABS
30.0000 mg | ORAL_TABLET | Freq: Every day | ORAL | Status: DC
  Administered 2020-01-13 – 2020-01-17 (×5): 30 mg via ORAL
  Filled 2020-01-13 (×5): qty 2

## 2020-01-13 MED ORDER — B COMPLEX-C PO TABS
1.0000 | ORAL_TABLET | Freq: Every day | ORAL | Status: DC
  Administered 2020-01-14 – 2020-01-18 (×5): 1 via ORAL
  Filled 2020-01-13 (×5): qty 1

## 2020-01-13 MED ORDER — CHOLECALCIFEROL 10 MCG (400 UNIT) PO TABS
400.0000 [IU] | ORAL_TABLET | Freq: Every day | ORAL | Status: DC
  Administered 2020-01-14 – 2020-01-18 (×5): 400 [IU] via ORAL
  Filled 2020-01-13 (×5): qty 1

## 2020-01-13 MED ORDER — ONDANSETRON HCL 4 MG/2ML IJ SOLN: 4.0000 mg | Freq: Four times a day (QID) | INTRAMUSCULAR | Status: DC | PRN

## 2020-01-13 MED ORDER — HEPARIN SODIUM (PORCINE) 5000 UNIT/ML IJ SOLN
5000.0000 [IU] | Freq: Three times a day (TID) | INTRAMUSCULAR | Status: DC
  Administered 2020-01-13 – 2020-01-18 (×12): 5000 [IU] via SUBCUTANEOUS
  Filled 2020-01-13 (×15): qty 1

## 2020-01-13 MED ORDER — VALACYCLOVIR HCL 500 MG PO TABS
500.0000 mg | ORAL_TABLET | Freq: Two times a day (BID) | ORAL | Status: DC
  Administered 2020-01-13 – 2020-01-18 (×10): 500 mg via ORAL
  Filled 2020-01-13 (×11): qty 1

## 2020-01-13 MED ORDER — SODIUM CHLORIDE 0.9 % IV BOLUS
1000.0000 mL | Freq: Once | INTRAVENOUS | Status: AC
  Administered 2020-01-13: 1000 mL via INTRAVENOUS

## 2020-01-13 MED ORDER — SODIUM CHLORIDE 0.9% FLUSH: 3.0000 mL | INTRAVENOUS | Status: DC | PRN

## 2020-01-13 NOTE — ED Notes (Signed)
Pt states he has fallen 4x since surgery on the 25th. States he lives alone and needs assistance w/ walking at home. Per pt pain was so bad this morning, he took 15mg  oxycodone and tried to call insurance company about getting home health, then he "melted to the floor", denies hitting head or LOC.

## 2020-01-13 NOTE — H&P (Signed)
BP 132/83   Pulse 88   Temp 98.5 F (36.9 C) (Oral)   Resp (!) 27   SpO2 98%  Martin Suarez is well known to the practice just having had a three level lumbar discetomy on 5/25 for a left L3/4 laminectomy for disc, bilateral l4/5 discetomies, and a right L5/1laminectomy for disc herniation. Post op he did well immediately. However he call our office yesterday complaining of severe pain. He states he has fallen 4 times between yesterday and today. He comes to the ED due to difficulty walking and perceived weakness in the right lower extremity(hamstrings). He is taking pain medication with some minor relief. Bowel and bladder continence is fine.  No Known Allergies Past Medical History:  Diagnosis Date  . GERD (gastroesophageal reflux disease)   . High cholesterol   . Low testosterone   . Syncope    Past Surgical History:  Procedure Laterality Date  . BACK SURGERY    . NISSEN FUNDOPLICATION     No family history on file. Social History   Socioeconomic History  . Marital status: Divorced    Spouse name: Not on file  . Number of children: Not on file  . Years of education: Not on file  . Highest education level: Not on file  Occupational History  . Not on file  Tobacco Use  . Smoking status: Never Smoker  Substance and Sexual Activity  . Alcohol use: No  . Drug use: Yes    Types: Marijuana  . Sexual activity: Not on file  Other Topics Concern  . Not on file  Social History Narrative  . Not on file   Social Determinants of Health   Financial Resource Strain:   . Difficulty of Paying Living Expenses:   Food Insecurity:   . Worried About Programme researcher, broadcasting/film/video in the Last Year:   . Barista in the Last Year:   Transportation Needs:   . Freight forwarder (Medical):   Marland Kitchen Lack of Transportation (Non-Medical):   Physical Activity:   . Days of Exercise per Week:   . Minutes of Exercise per Session:   Stress:   . Feeling of Stress :   Social Connections:   . Frequency  of Communication with Friends and Family:   . Frequency of Social Gatherings with Friends and Family:   . Attends Religious Services:   . Active Member of Clubs or Organizations:   . Attends Banker Meetings:   Marland Kitchen Marital Status:   Intimate Partner Violence:   . Fear of Current or Ex-Partner:   . Emotionally Abused:   Marland Kitchen Physically Abused:   . Sexually Abused:    Physical Exam Constitutional:      General: He is in acute distress.     Appearance: Normal appearance.  HENT:     Head: Normocephalic and atraumatic.     Right Ear: Tympanic membrane normal.     Left Ear: Tympanic membrane normal.     Nose: Nose normal.     Mouth/Throat:     Mouth: Mucous membranes are moist.     Pharynx: Oropharynx is clear.  Eyes:     Extraocular Movements: Extraocular movements intact.     Conjunctiva/sclera: Conjunctivae normal.     Pupils: Pupils are equal, round, and reactive to light.  Cardiovascular:     Rate and Rhythm: Normal rate and regular rhythm.  Pulmonary:     Effort: Pulmonary effort is normal.  Breath sounds: Normal breath sounds.  Abdominal:     General: Abdomen is flat.     Palpations: Abdomen is soft.  Musculoskeletal:        General: Normal range of motion.     Cervical back: Normal range of motion.  Neurological:     Mental Status: He is alert and oriented to person, place, and time.     Cranial Nerves: Cranial nerve deficit present.     Sensory: Sensation is intact.     Motor: Motor function is intact.     Coordination: Coordination is intact.     Deep Tendon Reflexes:     Reflex Scores:      Tricep reflexes are 2+ on the right side and 2+ on the left side.      Bicep reflexes are 2+ on the right side and 2+ on the left side.      Brachioradialis reflexes are 2+ on the right side and 2+ on the left side.      Patellar reflexes are 2+ on the right side and 2+ on the left side.      Achilles reflexes are 0 on the right side and 0 on the left side.     Comments: Decreased hearing bilaterally, right > left Slight weakness in right hamstrings No clonus, normal proprioception Did not assess gait    Admit for difficulty with gait and multiple falls status post lumbar discetomies. At this time he has too much pain to undergo a good mri.

## 2020-01-13 NOTE — ED Provider Notes (Signed)
[facility] EMERGENCY DEPARTMENT Provider Note   CSN: [id number] Arrival date & time: 01/13/20  1120     History Chief Complaint  Patient presents with  . Leg Pain    Martin Suarez is a 69 y.o. male.  HPI 69 year old male presents with bilateral leg pain and weakness.  On May 25 he had lower back surgery.  He had 2 herniated disks that has been causing sciatic type pain.  He states his back feels great but he is having intractable bilateral leg pain from his buttocks down to his ankles.  This is more intense than before surgery.  Surgery by Dr. Jordan Likes.  Has been taking Percocet, muscle relaxer and Medrol Dosepak without relief.  Instead of 2 Percocet he took 3 this morning which did help.  He has fallen 4 times due to his legs giving out.  No injuries from these.  Called his neurosurgery team to see if he can get some help with home health at home but they recommended they cannot do this from their office.  He denies any other complaints such as fever, urinary/bowel incontinence, numbness to his legs, or vomiting/diarrhea.   Past Medical History:  Diagnosis Date  . GERD (gastroesophageal reflux disease)   . High cholesterol   . Low testosterone   . Syncope     Patient Active Problem List   Diagnosis Date Noted  . HNP (herniated nucleus pulposus), lumbar 01/13/2020    Past Surgical History:  Procedure Laterality Date  . BACK SURGERY    . NISSEN FUNDOPLICATION         No family history on file.  Social History   Tobacco Use  . Smoking status: Never Smoker  Substance Use Topics  . Alcohol use: No  . Drug use: Yes    Types: Marijuana    Home Medications Prior to Admission medications   Medication Sig Start Date End Date Taking? Authorizing Provider  aspirin EC 81 MG tablet Take 81 mg by mouth daily.    [provider]  b complex vitamins tablet Take 1 tablet by mouth daily.    [provider]  B Complex-C (SUPER B COMPLEX PO) Take by  mouth.    [provider]  Cholecalciferol (VITAMIN D PO) Take 1 capsule by mouth daily.    [provider]  cyclobenzaprine (FLEXERIL) 10 MG tablet Take 10 mg by mouth 2 (two) times daily as needed for muscle spasms.    [provider]  finasteride (PROSCAR) 5 MG tablet Take 5 mg by mouth daily.    [provider]  folic acid (FOLVITE) 1 MG tablet Take 1 mg by mouth daily.    [provider]  MAGNESIUM PO Take 1 tablet by mouth daily.    [provider]  mirtazapine (REMERON) 30 MG tablet Take 30 mg by mouth at bedtime.    [provider]  omeprazole (PRILOSEC) 20 MG capsule Take 40 mg by mouth daily.    [provider]  simvastatin (ZOCOR) 40 MG tablet Take 40 mg by mouth daily.    [provider]  valACYclovir (VALTREX) 500 MG tablet Take 500 mg by mouth 2 (two) times daily.    [provider]  Venlafaxine HCl 225 MG TB24 Take 225 mg by mouth at bedtime.    [provider]    Allergies    Patient has no known allergies.  Review of Systems   Review of Systems  Constitutional: Negative for  fever.  Gastrointestinal: Negative for abdominal pain, diarrhea and vomiting.  Genitourinary: Negative for dysuria.       No incontinence  Musculoskeletal: Negative for back pain.  Neurological: Positive for weakness. Negative for numbness.  All other systems reviewed and are negative.   Physical Exam Updated Vital Signs BP (!) 135/92   Pulse 88   Temp 98.5 F (36.9 C) (Oral)   Resp 20   SpO2 98%   Physical Exam Vitals and nursing note reviewed.  Constitutional:      General: He is not in acute distress.    Appearance: He is well-developed. He is not ill-appearing or diaphoretic.  HENT:     Head: Normocephalic and atraumatic.     Right Ear: External ear normal.     Left Ear: External ear normal.     Nose: Nose normal.  Eyes:     General:        Right eye: No discharge.        Left  eye: No discharge.  Cardiovascular:     Rate and Rhythm: Normal rate and regular rhythm.     Heart sounds: Normal heart sounds.  Pulmonary:     Effort: Pulmonary effort is normal.     Breath sounds: Normal breath sounds.  Abdominal:     Palpations: Abdomen is soft.     Tenderness: There is no abdominal tenderness.  Musculoskeletal:     Cervical back: Neck supple.     Lumbar back: No tenderness.       Back:  Skin:    General: Skin is warm and dry.  Neurological:     Mental Status: He is alert.     Deep Tendon Reflexes:     Reflex Scores:      Patellar reflexes are 2+ on the right side and 2+ on the left side.      Achilles reflexes are 2+ on the right side and 2+ on the left side.    Comments: 5/5 strength in BLE. Normal gross sensation. Is able to ambulate but is painful  Psychiatric:        Mood and Affect: Mood is not anxious.     ED Results / Procedures / Treatments   Labs (all labs ordered are listed, but only abnormal results are displayed) Labs Reviewed  COMPREHENSIVE METABOLIC PANEL - Abnormal; Notable for the following components:      Result Value   Sodium 132 (*)    Chloride 96 (*)    Glucose, Bld 127 (*)    Creatinine, Ser 1.25 (*)    ALT 61 (*)    GFR calc non Af Amer 58 (*)    All other components within normal limits  CBC WITH DIFFERENTIAL/PLATELET - Abnormal; Notable for the following components:   WBC 18.0 (*)    RBC 4.05 (*)    Platelets 436 (*)    Neutro Abs 14.6 (*)    Abs Immature Granulocytes 0.13 (*)    All other components within normal limits  CBC WITH DIFFERENTIAL/PLATELET - Abnormal; Notable for the following components:   WBC 14.7 (*)    RBC 3.63 (*)    Hemoglobin 11.8 (*)    HCT 35.4 (*)    Neutro Abs 11.4 (*)    Abs Immature Granulocytes 0.11 (*)    All other components within normal limits  SARS CORONAVIRUS 2 BY RT PCR ([facility])  APTT  PROTIME-INR  CREATININE, SERUM  URINALYSIS,  ROUTINE W REFLEX MICROSCOPIC  HIV ANTIBODY (ROUTINE TESTING W REFLEX)  CBC    EKG EKG Interpretation  Date/Time:  Wednesday January 13 2020 13:04:47 EDT Ventricular Rate:  86 PR Interval:    QRS Duration: 114 QT Interval:  376 QTC Calculation: 450 R Axis:   -9 Text Interpretation: Sinus rhythm Incomplete right bundle branch block Confirmed by Pricilla Loveless [phone]) on 01/13/2020 1:15:32 PM   Radiology No results found.  Procedures Procedures (including critical care time)  Medications Ordered in ED Medications  aspirin EC tablet 81 mg (has no administration in time range)  valACYclovir (VALTREX) tablet 500 mg (has no administration in time range)  simvastatin (ZOCOR) tablet 40 mg (has no administration in time range)  mirtazapine (REMERON) tablet 30 mg (has no administration in time range)  Venlafaxine HCl TB24 225 mg (has no administration in time range)  pantoprazole (PROTONIX) EC tablet 40 mg (has no administration in time range)  finasteride (PROSCAR) tablet 5 mg (has no administration in time range)  folic acid (FOLVITE) tablet 1 mg (has no administration in time range)  b complex vitamins tablet 1 tablet (has no administration in time range)  Magnesium TABS 200 mg (has no administration in time range)  cholecalciferol (VITAMIN D3) tablet 400 Units (has no administration in time range)  oxyCODONE (Oxy IR/ROXICODONE) immediate release tablet 5-10 mg (has no administration in time range)  oxyCODONE (OXYCONTIN) 12 hr tablet 15 mg (has no administration in time range)  0.9 % NaCl with KCl 20 mEq/ L  infusion (has no administration in time range)  sodium chloride flush (NS) 0.9 % injection 3 mL (3 mLs Intravenous Not Given 01/13/20 1609)  sodium chloride flush (NS) 0.9 % injection 3 mL (has no administration in time range)  0.9 %  sodium chloride infusion (has no administration in time range)  ondansetron (ZOFRAN) tablet 4 mg (has no administration in time range)    Or   ondansetron (ZOFRAN) injection 4 mg (has no administration in time range)  heparin injection 5,000 Units (has no administration in time range)  acetaminophen (TYLENOL) tablet 650 mg (has no administration in time range)    Or  acetaminophen (TYLENOL) suppository 650 mg (has no administration in time range)  diazepam (VALIUM) tablet 5 mg (has no administration in time range)  sodium chloride 0.9 % bolus 1,000 mL (0 mLs Intravenous Stopped 01/13/20 1425)  HYDROmorphone (DILAUDID) injection 1 mg (1 mg Intravenous Given 01/13/20 1321)    ED Course  I have reviewed the triage vital signs and the nursing notes.  Pertinent labs & imaging results that were available during my care of the patient were reviewed by me and considered in my medical decision making (see chart for details).    MDM Rules/Calculators/A&P                      Patient's wound looks good.  His labs do show a leukocytosis but this is probably from being on steroids.  No appreciable weakness right now.  A lot of it seems to be a pain control issue.  Neurosurgery consulted and Dr. Franky Macho will admit for pain control and further work-up. Final Clinical Impression(s) / ED Diagnoses Final diagnoses:  Weakness of both lower extremities    Rx / DC Orders ED Discharge Orders    None       Pricilla Loveless, MD 01/13/20 1630

## 2020-01-13 NOTE — ED Triage Notes (Signed)
  Pt had back surgery 25 of April. Has had sciatic nerve pain down both legs since. Pt reports frequent falls and trouble controlling his pain. States he took 15 mg of oxycodone this morning and states it has helped. No new incontinence. State he could barely walk 4 steps this morning dt pain and he lives alone.

## 2020-01-14 ENCOUNTER — Inpatient Hospital Stay (HOSPITAL_COMMUNITY): Payer: No Typology Code available for payment source

## 2020-01-14 ENCOUNTER — Encounter (HOSPITAL_COMMUNITY): Payer: Self-pay | Admitting: Neurosurgery

## 2020-01-14 MED ORDER — POLYETHYLENE GLYCOL 3350 17 G PO PACK
17.0000 g | PACK | Freq: Every day | ORAL | Status: DC
  Administered 2020-01-14 – 2020-01-18 (×4): 17 g via ORAL
  Filled 2020-01-14 (×5): qty 1

## 2020-01-14 MED ORDER — KETOROLAC TROMETHAMINE 30 MG/ML IJ SOLN
15.0000 mg | Freq: Four times a day (QID) | INTRAMUSCULAR | Status: AC
  Administered 2020-01-14 – 2020-01-17 (×11): 15 mg via INTRAVENOUS
  Filled 2020-01-14 (×12): qty 1

## 2020-01-14 MED ORDER — MELATONIN 3 MG PO TABS
3.0000 mg | ORAL_TABLET | Freq: Every day | ORAL | Status: DC
  Administered 2020-01-14 – 2020-01-17 (×4): 3 mg via ORAL
  Filled 2020-01-14 (×4): qty 1

## 2020-01-14 MED ORDER — KETOROLAC TROMETHAMINE 30 MG/ML IJ SOLN: 30.0000 mg | Freq: Four times a day (QID) | INTRAMUSCULAR | Status: DC

## 2020-01-14 MED ORDER — GADOBUTROL 1 MMOL/ML IV SOLN
10.0000 mL | Freq: Once | INTRAVENOUS | Status: AC | PRN
  Administered 2020-01-14: 10 mL via INTRAVENOUS

## 2020-01-14 NOTE — Progress Notes (Signed)
   Providing Compassionate, Quality Care - Together   Subjective: Patient reports he still has significant pain down bilateral lower extremities. He feels he doesn't have enough support to go home at discharge. He has his home medications with him and tells me he took a dose of his methylprednisolone from his Medrol Dosepak that was prescribed prior to this hospital admission. I advised him not to take any of his home medication while in the hospital and notified nursing staff so they could secure these medications with pharmacy.  Objective: Vital signs in last 24 hours: Temp:  [98.2 F (36.8 C)-98.9 F (37.2 C)] 98.2 F (36.8 C) (06/03 0428) Pulse Rate:  [72-99] 91 (06/03 0428) Resp:  [12-27] 17 (06/03 0428) BP: (109-149)/(74-95) 140/80 (06/03 0428) SpO2:  [96 %-98 %] 96 % (06/03 0428)  Intake/Output from previous day: 06/02 0701 - 06/03 0700 In: 2636.3 [P.O.:760; I.V.:876.3; IV Piggyback:1000] Out: 2450 [Urine:2450] Intake/Output this shift: No intake/output data recorded.  Alert and oriented x 4 PERRLA CN II-XII grossly intact MAE, Strength and sensation intact Incision is clean, dry, and intact   Lab Results: Recent Labs    01/13/20 1256 01/13/20 1547  WBC 18.0* 14.7*  HGB 13.2 11.8*  HCT 39.3 35.4*  PLT 436* 392   BMET Recent Labs    01/13/20 1256 01/13/20 1547  NA 132*  --   K 4.0  --   CL 96*  --   CO2 23  --   GLUCOSE 127*  --   BUN 21  --   CREATININE 1.25* 1.14  CALCIUM 9.5  --     Studies/Results: No results found.  Assessment/Plan: Patient is 9 days status post 3 level microdiscectomy by Dr. Jordan Likes. He was admitted yesterday due to uncontrolled pain.   LOS: 1 day    -UA shows bacteria. Will get culture to see if anything grows before starting antibiotic. Patient appears asymptomatic. -MRI lumbar spine ordered to assess for neural impingement. -Spoke with nurse. She will get patient's home medications checked in to pharmacy or sent home with  a family member. -Started Toradol for pain management.   Val Eagle, DNP, AGNP-C Nurse Practitioner  [facility] [address], [place], [place] [address] P: [phone]    F: [phone]  01/14/2020, 11:34 AM

## 2020-01-14 NOTE — Evaluation (Addendum)
Physical Therapy Evaluation Patient Details Name: Martin Suarez MRN: [id number] DOB: 12-29-1950 Today's Date: 01/14/2020   History of Present Illness  Martin Suarez is well known to the practice just having had a three level lumbar discetomy on 5/25 for a left L3/4 laminectomy for disc, bilateral l4/5 discetomies, and a right L5/1laminectomy for disc herniation. Post op he did well immediately. However he called the doctor's office yesterday complaining of severe pain. He states he has fallen 4 times between yesterday and today. He comes to the ED due to difficulty walking and perceived weakness in the right lower extremity (hamstring). He is taking pain medication with some minor relief. Bowel and bladder continence is intact.  MRI pending  Clinical Impression  Pt admitted with/for progressive pain post surgery. Pt moves at a min guard to mod I level.  Reinforced all back care/precautions and progression of activity from this time forward.   Pt currently limited functionally due to the problems listed below.  (see problems list.)  Pt will benefit from PT to maximize function and safety to be able to get home safely with available assist .     Follow Up Recommendations No PT follow up;Supervision - Intermittent    Equipment Recommendations  Rolling walker with 5" wheels    Recommendations for Other Services       Precautions / Restrictions Precautions Precautions: Fall Precaution Comments: pt reports having 4 falls in the last few days secondary to pain and weakness in BLEs Restrictions Weight Bearing Restrictions: No      Mobility  Bed Mobility Overal bed mobility: Modified Independent Bed Mobility: Supine to Sit;Sit to Supine     Supine to sit: Modified independent (Device/Increase time);Supervision;HOB elevated Sit to supine: Modified independent (Device/Increase time);Supervision;HOB elevated   General bed mobility comments: up with CNA walking on arrival  Transfers Overall  transfer level: Needs assistance Equipment used: Rolling walker (2 wheeled) Transfers: Sit to/from Stand Sit to Stand: Min guard         General transfer comment: pt sit>stand from arm chair with min guard for safety and RW for support; no LOB noted; no dizziness reported; pt tolerated standing for >1 minute getting his personal belongs locked up.  Ambulation/Gait Ambulation/Gait assistance: Min guard Gait Distance (Feet): 200 Feet Assistive device: Rolling walker (2 wheeled) Gait Pattern/deviations: Step-through pattern Gait velocity: slower Gait velocity interpretation: 1.31 - 2.62 ft/sec, indicative of limited community ambulator General Gait Details: cues for posture, better proximity to the RW,   Stairs            Wheelchair Mobility    Modified Rankin (Stroke Patients Only)       Balance Overall balance assessment: Needs assistance;History of Falls Sitting-balance support: No upper extremity supported;Feet supported Sitting balance-Leahy Scale: Normal     Standing balance support: Bilateral upper extremity supported;During functional activity Standing balance-Leahy Scale: Fair Standing balance comment: pt tolerated standing for 1 minute with min guard for safety using RW for support; pt able to march in place without difficulty                             Pertinent Vitals/Pain Pain Assessment: 0-10 Pain Score: 10-Worst pain ever Pain Location: RLE Pain Descriptors / Indicators: Aching;Burning;Discomfort;Dull;Grimacing;Guarding;Jabbing;Moaning;Shooting Pain Intervention(s): Limited activity within patient's tolerance;Monitored during session    Home Living Family/patient expects to be discharged to:: Private residence Living Arrangements: Alone Available Help at Discharge: Friend(s);Available PRN/intermittently Type of Home: Apartment Home  Access: Level entry     Home Layout: Multi-level(pt reports living in the basement with walk in  access) Home Equipment: Grab bars - toilet;Grab bars - tub/shower Additional Comments: pt reports having minor difficulty transferring in/out of clawfoot bathtub    Prior Function Level of Independence: Independent         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   Dominant Hand: Right    Extremity/Trunk Assessment   Upper Extremity Assessment Upper Extremity Assessment: Overall WFL for tasks assessed    Lower Extremity Assessment Lower Extremity Assessment: Overall WFL for tasks assessed    Cervical / Trunk Assessment Cervical / Trunk Assessment: Normal  Communication   Communication: No difficulties  Cognition Arousal/Alertness: Awake/alert Behavior During Therapy: WFL for tasks assessed/performed;Anxious Overall Cognitive Status: Within Functional Limits for tasks assessed                                 General Comments: pt was overwhelmed but thankful for all the health care thus far      General Comments General comments (skin integrity, edema, etc.): pt advised on back care/prec, log roll, transition to/from sitting, lifting restrictions, and progression of activity.    Exercises     Assessment/Plan    PT Assessment Patient needs continued PT services  PT Problem List Decreased activity tolerance;Decreased knowledge of use of DME;Decreased knowledge of precautions;Decreased mobility       PT Treatment Interventions Gait training;Functional mobility training;Therapeutic activities;Patient/family education;DME instruction;Stair training    PT Goals (Current goals can be found in the Care Plan section)  Acute Rehab PT Goals Patient Stated Goal: to go to rehab before returning home PT Goal Formulation: With patient Time For Goal Achievement: 01/21/20 Potential to Achieve Goals: Good    Frequency Min 3X/week   Barriers to discharge        Co-evaluation               AM-PAC PT "6 Clicks" Mobility  Outcome  Measure Help needed turning from your back to your side while in a flat bed without using bedrails?: None Help needed moving from lying on your back to sitting on the side of a flat bed without using bedrails?: None Help needed moving to and from a bed to a chair (including a wheelchair)?: A Little Help needed standing up from a chair using your arms (e.g., wheelchair or bedside chair)?: A Little Help needed to walk in hospital room?: A Little Help needed climbing 3-5 steps with a railing? : A Little 6 Click Score: 20    End of Session   Activity Tolerance: Patient tolerated treatment well;Patient limited by pain Patient left: in chair;with call bell/phone within reach Nurse Communication: Mobility status PT Visit Diagnosis: Other abnormalities of gait and mobility (R26.89);Pain Pain - part of body: (back)    Time: 6237-6283 PT Time Calculation (min) (ACUTE ONLY): 27 min   Charges:   PT Evaluation $PT Eval Moderate Complexity: 1 Mod PT Treatments $Gait Training: 8-22 mins        01/14/2020  Ginger Carne., PT Acute Rehabilitation Services [phone]  (pager) [phone]  (office)  Tessie Fass Rachyl Wuebker 01/14/2020, 1:00 PM

## 2020-01-14 NOTE — Progress Notes (Signed)
Patient ID: Martin Suarez, male   DOB: 1951-04-16, 69 y.o.   MRN: [id number] BP 131/83 (BP Location: Left Arm)   Pulse 83   Temp 98.3 F (36.8 C) (Oral)   Resp 18   SpO2 97%  Mri reviewed he has compressive fluid collections in the lumbar spine. While his strength is preserved his pain remains severe.  I will reassess in the morning but we have discussed possible return to the OR to evacuate the collections.

## 2020-01-14 NOTE — Progress Notes (Signed)
Sent transport for patient to come to MRI. Transport called and stated that patient was irate and yelling at him, refusing to come down because we did not coordinate with his meal times accordingly. Patient refusing scan all together at the moment. Will try again at a later time.

## 2020-01-14 NOTE — Progress Notes (Signed)
Occupational Therapy Evaluation Patient Details Name: Martin Suarez MRN: [id number] DOB: Nov 16, 1950 Today's Date: 01/14/2020    History of Present Illness [name] is well known to the practice just having had a three level lumbar discetomy on 5/25 for a left L3/4 laminectomy for disc, bilateral l4/5 discetomies, and a right L5/1laminectomy for disc herniation. Post op he did well immediately. However he called the doctor's office yesterday complaining of severe pain. He states he has fallen 4 times between yesterday and today. He comes to the ED due to difficulty walking and perceived weakness in the right lower extremity (hamstring). He is taking pain medication with some minor relief. Bowel and bladder continence is intact.   Clinical Impression   Prior to hospitalization, pt was independent with all ADLs/IADLs, drove, and worked as a Music therapist. Pt lives alone in an apartment on the basement floor with walk-in access and no stairs. Pt admitted for above and limited by increased pain in BLEs/lower back, decreased activity tolerance, decreased balance (multiple falls), and decreased safety awareness. Today, pt received sitting upright in bed, agreeable to OT eval. Nursing administered pain medication before OT entered. Pt reports 10/10 pain in his BLEs- grimacing, guarding, and moaning throughout session. Assessed bed mobility, functional transfer, LB dressing, and grooming. Pt requires supervision for bed mobility, min guard for sit>stand using RW for support, supervision for donning/doff socks, and setup for grooming in sitting. Limited participation today secondary to increased pain. Will plan on addressing more BADLs as pain is managed more consistently. Pt would benefit from continued skilled OT services to address ADLs/IADLs, activity tolerance, balance, and pain management techniques. Recommending CIR, if CIR is not an option then HHOT. Will continue to follow pt acutely as able.     Follow Up  Recommendations  CIR;Supervision/Assistance - 24 hour    Equipment Recommendations  Tub/shower bench;Other (comment)(long handled reacher and RW)    Recommendations for Other Services       Precautions / Restrictions Precautions Precautions: Fall Precaution Comments: pt reports having 4 falls in the last few days secondary to pain and weakness in BLEs Restrictions Weight Bearing Restrictions: No      Mobility Bed Mobility Overal bed mobility: Modified Independent Bed Mobility: Supine to Sit;Sit to Supine     Supine to sit: Modified independent (Device/Increase time);Supervision;HOB elevated Sit to supine: Modified independent (Device/Increase time);Supervision;HOB elevated   General bed mobility comments: pt utilized bed railings for support  Transfers Overall transfer level: Needs assistance Equipment used: Rolling walker (2 wheeled) Transfers: Sit to/from Stand Sit to Stand: Min guard         General transfer comment: pt sit>stand from bed with min guard for safety and RW for support; no LOB noted; no dizziness reported; pt tolerated standing for ~1 minute before sitting back down on bed secondary to BLE/lower back pain    Balance Overall balance assessment: Needs assistance;History of Falls Sitting-balance support: No upper extremity supported;Feet supported Sitting balance-Leahy Scale: Normal     Standing balance support: Bilateral upper extremity supported;During functional activity Standing balance-Leahy Scale: Fair Standing balance comment: pt tolerated standing for 1 minute with min guard for safety using RW for support; pt able to march in place without difficulty                           ADL either performed or assessed with clinical judgement   ADL Overall ADL's : Needs assistance/impaired Eating/Feeding: Independent;Sitting   Grooming: Wash/dry  face;Set up;Sitting   Upper Body Bathing: Set up;Sitting   Lower Body Bathing: Min  guard;Sitting/lateral leans;Sit to/from stand   Upper Body Dressing : Independent;Sitting   Lower Body Dressing: Min guard;Sitting/lateral leans;Sit to/from stand   Toilet Transfer: Min guard;BSC;RW   Toileting- Water quality scientist and Hygiene: Sitting/lateral lean;Sit to/from stand;Min guard   Tub/ Banker: Min guard;Stand-pivot;Shower seat;Grab bars;Rolling walker   Functional mobility during ADLs: Min guard;Cueing for safety;Rolling walker General ADL Comments: pt limited in BADL participation secondary to increased pain in RLE; will practice more functional transfers and ambulation to/from bathroom next treatment session     Vision Baseline Vision/History: No visual deficits       Perception     Praxis      Pertinent Vitals/Pain Pain Assessment: 0-10 Pain Score: 10-Worst pain ever Pain Location: RLE Pain Descriptors / Indicators: Aching;Burning;Discomfort;Dull;Grimacing;Guarding;Jabbing;Moaning;Shooting Pain Intervention(s): Limited activity within patient's tolerance;Monitored during session;Premedicated before session;Repositioned     Hand Dominance Right   Extremity/Trunk Assessment Upper Extremity Assessment Upper Extremity Assessment: Overall WFL for tasks assessed   Lower Extremity Assessment Lower Extremity Assessment: Defer to PT evaluation   Cervical / Trunk Assessment Cervical / Trunk Assessment: Normal   Communication Communication Communication: No difficulties   Cognition Arousal/Alertness: Awake/alert Behavior During Therapy: WFL for tasks assessed/performed;Anxious Overall Cognitive Status: Within Functional Limits for tasks assessed                                 General Comments: pt was overwhelmed but thankful for all the health care thus far   General Comments       Exercises     Shoulder Instructions      Home Living Family/patient expects to be discharged to:: Private residence Living Arrangements:  Alone Available Help at Discharge: Friend(s);Available PRN/intermittently Type of Home: Apartment Home Access: Level entry     Home Layout: Multi-level(pt reports living in the basement with walk in access)     Bathroom Shower/Tub: Tub/shower unit;Other (comment)(pt reports having a clawfoot tub with shower head)   Bathroom Toilet: Standard     Home Equipment: Grab bars - toilet;Grab bars - tub/shower   Additional Comments: pt reports having minor difficulty transferring in/out of clawfoot bathtub      Prior Functioning/Environment Level of Independence: Independent        Comments: pt worked as a Designer, jewellery Problem List: Decreased activity tolerance;Impaired balance (sitting and/or standing);Decreased safety awareness;Decreased knowledge of use of DME or AE;Pain      OT Treatment/Interventions: Self-care/ADL training;Therapeutic exercise;Energy conservation;DME and/or AE instruction;Therapeutic activities;Patient/family education    OT Goals(Current goals can be found in the care plan section) Acute Rehab OT Goals Patient Stated Goal: to go to rehab before returning home OT Goal Formulation: With patient Time For Goal Achievement: 01/28/20 Potential to Achieve Goals: Good  OT Frequency: Min 2X/week   Barriers to D/C:            Co-evaluation              AM-PAC OT "6 Clicks" Daily Activity     Outcome Measure Help from another person eating meals?: None Help from another person taking care of personal grooming?: None Help from another person toileting, which includes using toliet, bedpan, or urinal?: A Little Help from another person bathing (including washing, rinsing, drying)?: A Little Help from another person to put on and taking  off regular upper body clothing?: None Help from another person to put on and taking off regular lower body clothing?: A Little 6 Click Score: 21   End of Session Equipment Utilized During Treatment: Gait  belt;Rolling walker Nurse Communication: Mobility status;Other (comment)(pain medication)  Activity Tolerance: Patient tolerated treatment well;Patient limited by pain Patient left: in bed;with call bell/phone within reach  OT Visit Diagnosis: Unsteadiness on feet (R26.81);Repeated falls (R29.6);Muscle weakness (generalized) (M62.81);Pain Pain - Right/Left: Right Pain - part of body: Leg                Time: 3220-2542 OT Time Calculation (min): 40 min Charges:  OT General Charges $OT Visit: 1 Visit OT Evaluation $OT Eval Moderate Complexity: 1 Mod OT Treatments $Self Care/Home Management : 8-22 mins $Therapeutic Activity: 8-22 mins  Norris Cross, OTR/L [facility] Acute Rehab Services [phone]  Mechele Claude 01/14/2020, 12:11 PM

## 2020-01-15 LAB — BASIC METABOLIC PANEL
Anion gap: 11 (ref 5–15)
BUN: 16 mg/dL (ref 8–23)
CO2: 22 mmol/L (ref 22–32)
Calcium: 8.9 mg/dL (ref 8.9–10.3)
Chloride: 104 mmol/L (ref 98–111)
Creatinine, Ser: 1.33 mg/dL — ABNORMAL HIGH (ref 0.61–1.24)
GFR calc Af Amer: >60 mL/min (ref >60)
GFR calc non Af Amer: 54 mL/min — ABNORMAL LOW (ref >60)
Glucose, Bld: 113 mg/dL — ABNORMAL HIGH (ref 70–99)
Potassium: 3.9 mmol/L (ref 3.5–5.1)
Sodium: 137 mmol/L (ref 135–145)

## 2020-01-15 LAB — SURGICAL PCR SCREEN
MRSA, PCR: NEGATIVE
Staphylococcus aureus: NEGATIVE

## 2020-01-15 LAB — CBC
HCT: 32.6 % — ABNORMAL LOW (ref 39.0–52.0)
Hemoglobin: 11.1 g/dL — ABNORMAL LOW (ref 13.0–17.0)
MCH: 32.6 pg (ref 26.0–34.0)
MCHC: 34 g/dL (ref 30.0–36.0)
MCV: 95.9 fL (ref 80.0–100.0)
Platelets: 378 10*3/uL (ref 150–400)
RBC: 3.4 MIL/uL — ABNORMAL LOW (ref 4.22–5.81)
RDW: 12 % (ref 11.5–15.5)
WBC: 9.3 10*3/uL (ref 4.0–10.5)
nRBC: 0 % (ref 0.0–0.2)

## 2020-01-16 NOTE — Progress Notes (Signed)
Physical Therapy Treatment Patient Details Name: Martin Suarez MRN: [id number] DOB: 27-Apr-1951 Today's Date: 01/16/2020    History of Present Illness Mr. Rosemond is well known to the practice just having had a three level lumbar discetomy on 5/25 for a left L3/4 laminectomy for disc, bilateral l4/5 discetomies, and a right L5/1laminectomy for disc herniation. Post op he did well immediately. However he called the doctor's office yesterday complaining of severe pain. He states he has fallen 4 times between yesterday and today. He comes to the ED due to difficulty walking and perceived weakness in the right lower extremity (hamstring). He is taking pain medication with some minor relief. Bowel and bladder continence is intact.    PT Comments    Pt was seen for mobility on RW with walk on hallway using cues for posture.  Pt was repositioned on the chair with postural support and cues for body mechanics.  Pt will be seen acutely to continue to work on standing balance, to instruct body mechanics and to work on endurance with gait.      Follow Up Recommendations  No PT follow up     Equipment Recommendations  Rolling walker with 5" wheels    Recommendations for Other Services       Precautions / Restrictions Precautions Precautions: Fall Precaution Comments: monitor back pain Restrictions Weight Bearing Restrictions: No    Mobility  Bed Mobility Overal bed mobility: Modified Independent             General bed mobility comments: up in chair  Transfers Overall transfer level: Needs assistance Equipment used: Rolling walker (2 wheeled) Transfers: Sit to/from Stand Sit to Stand: Min guard            Ambulation/Gait Ambulation/Gait assistance: Min guard Gait Distance (Feet): 160 Feet Assistive device: Rolling walker (2 wheeled) Gait Pattern/deviations: Step-through pattern Gait velocity: reduced       Stairs             Wheelchair Mobility    Modified Rankin  (Stroke Patients Only)       Balance Overall balance assessment: Needs assistance;History of Falls   Sitting balance-Leahy Scale: Good       Standing balance-Leahy Scale: Fair                              Cognition Arousal/Alertness: Awake/alert Behavior During Therapy: WFL for tasks assessed/performed;Anxious Overall Cognitive Status: Within Functional Limits for tasks assessed                                        Exercises      General Comments General comments (skin integrity, edema, etc.): repositioning on chair      Pertinent Vitals/Pain Pain Assessment: 0-10 Pain Score: 6  Pain Location: back Pain Descriptors / Indicators: Grimacing;Guarding Pain Intervention(s): Limited activity within patient's tolerance;Monitored during session;Premedicated before session;Repositioned    Home Living                      Prior Function            PT Goals (current goals can now be found in the care plan section) Acute Rehab PT Goals Patient Stated Goal: get home and feel less back pain    Frequency    Min 3X/week      PT Plan  Current plan remains appropriate    Co-evaluation              AM-PAC PT "6 Clicks" Mobility   Outcome Measure  Help needed turning from your back to your side while in a flat bed without using bedrails?: None Help needed moving from lying on your back to sitting on the side of a flat bed without using bedrails?: None Help needed moving to and from a bed to a chair (including a wheelchair)?: None Help needed standing up from a chair using your arms (e.g., wheelchair or bedside chair)?: A Little Help needed to walk in hospital room?: A Little Help needed climbing 3-5 steps with a railing? : A Little 6 Click Score: 21    End of Session Equipment Utilized During Treatment: Gait belt Activity Tolerance: Patient tolerated treatment well;Patient limited by fatigue Patient left: in chair;with  call bell/phone within reach Nurse Communication: Mobility status PT Visit Diagnosis: Other abnormalities of gait and mobility (R26.89);Pain     Time: 0349-1791 PT Time Calculation (min) (ACUTE ONLY): 25 min  Charges:  $Gait Training: 8-22 mins $Therapeutic Activity: 8-22 mins              Ivar Drape 01/16/2020, 12:03 AM

## 2020-01-16 NOTE — Progress Notes (Signed)
NEUROSURGERY PROGRESS NOTE Status post lumbar decompression, readmitted for pain control and subjective weakness. Today he seems in better spirits and pain seems to be well controlled. MRI shows a postoperative fluid collection which extends into the epidural space with some compression of his thecal sac at L3-4 and L4-5. During our exam today his strength was 5/5 and pain has improved. He expressed to [place] that if he has to go back to the OR for a wound washout that he only wants Dr. Jordan Likes doing it. I believe that this is reasonable considering he has no focal weakness at this time. Continue pain management and therapies today.   Temp:  [98.1 F (36.7 C)-98.4 F (36.9 C)] 98.4 F (36.9 C) (06/05 0538) Pulse Rate:  [62-65] 63 (06/05 0538) Resp:  [17-18] 18 (06/05 0538) BP: (123-129)/(72-81) 129/81 (06/05 0538) SpO2:  [97 %-99 %] 97 % (06/05 0538)   Sherryl Manges, NP 01/16/2020 9:54 AM

## 2020-01-17 LAB — URINE CULTURE: Culture: 20000 — AB

## 2020-01-17 NOTE — Progress Notes (Addendum)
Occupational Therapy Treatment Patient Details Name: Martin Suarez MRN: [id number] DOB: 05-07-1951 Today's Date: 01/17/2020    History of present illness Mr. Clearman is well known to the practice just having had a three level lumbar discetomy on 5/25 for a left L3/4 laminectomy for disc, bilateral l4/5 discetomies, and a right L5/1laminectomy for disc herniation. Post op he did well immediately. However he called the doctor's office yesterday complaining of severe pain. He states he has fallen 4 times between yesterday and today. He comes to the ED due to difficulty walking and perceived weakness in the right lower extremity (hamstring). He is taking pain medication with some minor relief. Bowel and bladder continence is intact.   OT comments  Pt. Seen for skilled OT treatment session. Pt. Performing ADLs at min guard a.  Reports pain is better managed when on a schedule but reports he forgets at home.  Provided examples to aide in pill management ie: alarms on phone, making a chart.  He states he would need to try it.  Per further conversation while describing home environment I noted several barriers for a safe and functional return home.  Pt. States he has a basement apt. that has a very steep hill he has to walk up and down when accessing his door. States he feels un comfortable if someone is not walking with him.  Reports he has little to no assistance and the one friend he has is "reluctant" to help. States he can not access his tub due to a step to get to it and its also elevated.  When asked how he gets groceries, medications, to/from apts. He said "what I have is what I have".  Reviewed my concerns for a safe return home given what he has described.  I contacted (with his permission) SW, Crystal.  She met with pt. To get a more detailed narrative and see how we should proceed. Note: possible d/c tomorrow but it appears some additional planning and resources are needed prior to home.     Follow Up  Recommendations  CIR;Supervision/Assistance - 24 hour -d/c plans may need to be updated   Equipment Recommendations  Tub/shower bench;Other (comment)    Recommendations for Other Services      Precautions / Restrictions Precautions Precautions: Fall Precaution Comments: monitor back pain       Mobility Bed Mobility Overal bed mobility: Modified Independent                Transfers Overall transfer level: Needs assistance Equipment used: Rolling walker (2 wheeled) Transfers: Sit to/from [employer] Sit to Stand: Min guard Stand pivot transfers: Min guard            Balance                                           ADL either performed or assessed with clinical judgement   ADL Overall ADL's : Needs assistance/impaired     Grooming: Supervision/safety;Min guard Grooming Details (indicate cue type and reason): pt. stood at sink declined need for actual comletion of tasks. educated on rw management and safety while standing at a Public relations account executive Transfer: Paramedic Details (indicate cue type and reason): ambulated into b.room and simulated how he would turn and get ready  for use but declined need for actual use         Functional mobility during ADLs: Min guard;Cueing for safety;Rolling walker General ADL Comments: pt. disclosed env. barriers for succesful transiton home. reports basement apt. with a steep hill to get up once out of the door.  reports limited support from friends "if any". described it as "reluctant help".  does not drive. states tub has a step to get to and also is elevated.  spoke with pt. that i was going to reach out to sw for assistance to see what options are avialable to aide in smooth safe transtion home     Vision       Perception     Praxis      Cognition   Behavior During Therapy: [facility] for tasks assessed/performed;Anxious Overall  Cognitive Status: Within Functional Limits for tasks assessed                                          Exercises     Shoulder Instructions       General Comments      Pertinent Vitals/ Pain       Pain Assessment: No/denies pain  Home Living                                          Prior Functioning/Environment              Frequency  Min 2X/week        Progress Toward Goals  OT Goals(current goals can now be found in the care plan section)  Progress towards OT goals: Progressing toward goals     Plan      Co-evaluation                 AM-PAC OT "6 Clicks" Daily Activity     Outcome Measure   Help from another person eating meals?: None Help from another person taking care of personal grooming?: None Help from another person toileting, which includes using toliet, bedpan, or urinal?: A Little Help from another person bathing (including washing, rinsing, drying)?: A Little Help from another person to put on and taking off regular upper body clothing?: None Help from another person to put on and taking off regular lower body clothing?: A Little 6 Click Score: 21    End of Session Equipment Utilized During Treatment: Rolling walker  OT Visit Diagnosis: Unsteadiness on feet (R26.81);Repeated falls (R29.6);Muscle weakness (generalized) (M62.81);Pain Pain - Right/Left: Right Pain - part of body: Leg   Activity Tolerance Patient tolerated treatment well   Patient Left Other (comment)(left ambulating into room, sw present to meet with him)   Nurse Communication          Time: 7588-3254 OT Time Calculation (min): 19 min  Charges: OT General Charges $OT Visit: 1 Visit OT Treatments $Self Care/Home Management : 8-22 mins  Sonia Baller, COTA/L Acute Rehabilitation [phone]   Janice Coffin 01/17/2020, 12:10 PM

## 2020-01-17 NOTE — Progress Notes (Signed)
Neurosurgery Service Progress Note  Subjective: No acute events overnight, states his legs feel significant better, no numbness / weakness / radicular pain, only has some right buttock / hip pain   Objective: Vitals:   01/16/20 0538 01/16/20 1349 01/16/20 2053 01/17/20 0508  BP: 129/81 125/81 124/80 123/79  Pulse: 63 97 96 97  Resp: 18 18 17 18   Temp: 98.4 F (36.9 C) 98.1 F (36.7 C) 98 F (36.7 C) 98.1 F (36.7 C)  TempSrc: Oral Oral Oral Oral  SpO2: 97% 100% 99% 100%   Temp (24hrs), Avg:98.1 F (36.7 C), Min:98 F (36.7 C), Max:98.1 F (36.7 C)  CBC Latest Ref Rng & Units 01/15/2020 01/13/2020 01/13/2020  WBC 4.0 - 10.5 K/uL 9.3 14.7(H) 18.0(H)  Hemoglobin 13.0 - 17.0 g/dL 11.1(L) 11.8(L) 13.2  Hematocrit 39.0 - 52.0 % 32.6(L) 35.4(L) 39.3  Platelets 150 - 400 K/uL 378 392 436(H)   BMP Latest Ref Rng & Units 01/15/2020 01/13/2020 01/13/2020  Glucose 70 - 99 mg/dL 03/14/2020) - 193(X)  BUN 8 - 23 mg/dL 16 - 21  Creatinine 902(I - 1.24 mg/dL 0.97) 3.53(G 9.92)  Sodium 135 - 145 mmol/L 137 - 132(L)  Potassium 3.5 - 5.1 mmol/L 3.9 - 4.0  Chloride 98 - 111 mmol/L 104 - 96(L)  CO2 22 - 32 mmol/L 22 - 23  Calcium 8.9 - 10.3 mg/dL 8.9 - 9.5    Intake/Output Summary (Last 24 hours) at 01/17/2020 0804 Last data filed at 01/16/2020 2156 Gross per 24 hour  Intake 726 ml  Output --  Net 726 ml    Current Facility-Administered Medications:  .  0.9 %  sodium chloride infusion, 250 mL, Intravenous, PRN, 2157, MD .  acetaminophen (TYLENOL) tablet 650 mg, 650 mg, Oral, Q6H PRN **OR** acetaminophen (TYLENOL) suppository 650 mg, 650 mg, Rectal, Q6H PRN, Coletta Memos, MD .  aspirin EC tablet 81 mg, 81 mg, Oral, Daily, Pool, Coletta Memos, MD, 81 mg at 01/15/20 1002 .  B-complex with vitamin C tablet 1 tablet, 1 tablet, Oral, Daily, 03/16/20, MD, 1 tablet at 01/16/20 1011 .  cholecalciferol (VITAMIN D3) tablet 400 Units, 400 Units, Oral, Daily, 03/17/20, MD, 400 Units at 01/16/20 1005 .   diazepam (VALIUM) tablet 5 mg, 5 mg, Oral, Q6H PRN, 03/17/20, MD, 5 mg at 01/13/20 2217 .  finasteride (PROSCAR) tablet 5 mg, 5 mg, Oral, Daily, 2218, MD, 5 mg at 01/16/20 1005 .  folic acid (FOLVITE) tablet 1 mg, 1 mg, Oral, Daily, Cabbell, Kyle, MD, 1 mg at 01/16/20 1006 .  heparin injection 5,000 Units, 5,000 Units, Subcutaneous, Q8H, 03/17/20, MD, 5,000 Units at 01/17/20 03/18/20 .  melatonin tablet 3 mg, 3 mg, Oral, QHS, 3419, MD, 3 mg at 01/16/20 2154 .  mirtazapine (REMERON) tablet 30 mg, 30 mg, Oral, QHS, 2155, MD, 30 mg at 01/16/20 2152 .  ondansetron (ZOFRAN) tablet 4 mg, 4 mg, Oral, Q6H PRN **OR** ondansetron (ZOFRAN) injection 4 mg, 4 mg, Intravenous, Q6H PRN, 2153, MD .  oxyCODONE (Oxy IR/ROXICODONE) immediate release tablet 5-10 mg, 5-10 mg, Oral, Q4H PRN, Coletta Memos, MD, 10 mg at 01/14/20 2015 .  oxyCODONE (OXYCONTIN) 12 hr tablet 15 mg, 15 mg, Oral, Q12H, 03/15/20, MD, 15 mg at 01/16/20 2154 .  pantoprazole (PROTONIX) EC tablet 40 mg, 40 mg, Oral, Daily, 2155, MD, 40 mg at 01/16/20 1006 .  polyethylene glycol (MIRALAX / GLYCOLAX) packet 17 g, 17 g, Oral, Daily, Bergman, Meghan D,  NP, 17 g at 01/16/20 1004 .  simvastatin (ZOCOR) tablet 40 mg, 40 mg, Oral, Daily, Ashok Pall, MD, 40 mg at 01/16/20 1006 .  sodium chloride flush (NS) 0.9 % injection 3 mL, 3 mL, Intravenous, Q12H, Ashok Pall, MD, 3 mL at 01/16/20 2156 .  sodium chloride flush (NS) 0.9 % injection 3 mL, 3 mL, Intravenous, PRN, Ashok Pall, MD .  valACYclovir (VALTREX) tablet 500 mg, 500 mg, Oral, BID, Ashok Pall, MD, 500 mg at 01/16/20 2156 .  venlafaxine XR (EFFEXOR-XR) 24 hr capsule 225 mg, 225 mg, Oral, QHS, Ashok Pall, MD, 225 mg at 01/16/20 2152   Physical Exam: AOx3, PERRL, EOMI, FS, Strength 5/5 x4, SILTx4  Assessment & Plan: 69 y.o. man s/p decompression admitted for post-op pain, MRI w/ post-op fluid collection, Sx improving.   -Dr.  Annette Stable to see tomorrow, pt encouraged that his Sx are improving, possible discharge tomorrow if he continues to progress well  Judith Part  01/17/20 8:04 AM

## 2020-01-17 NOTE — TOC Initial Note (Addendum)
Transition of Care [facility]) - Initial/Assessment Note    Patient Details  Name: Martin Suarez MRN: [id number] Date of Birth: 09-23-1950  [facility] CM/SW Contact:    Elliot Gurney [place], [place] Phone Number: 01/17/2020, 11:18 AM  Clinical Narrative:                 Patient is a 69 year old male tatus post lumbar decompression, readmitted for pain control and subjective weakness. OT contacted this social worker with concerns that patient has has limited support at home and has a hill outside of his apartment that may be challenging. This Education officer, museum spoke with patient at bedside who confirmed having limited support and is open to hiring an in home aid if needed to assist him with errands and any other care needs. Per patient, he does have a hill outside of his apartment but plans to move his truck closer to his door so that he does not have to navigate it if needed. Patient able to utilize the automatic chairs at the grocery store as well. Patient confirms having a grab bar in his bathroom but states possibly needing a transfer bench to get in and out of the tub.  This Education officer, museum provided patient with resources for private duty aids Therapist, occupational and will await any additional recommendations from the OT/PT team (home or CIR) for additional support needs.   [place], LCSW Transitions of Care [phone]   Expected Discharge Plan: (pending) Barriers to Discharge: Other (comment)(patient states that he has a hill outside of his apartment that he is concerned about navigating  when needing to leave his home. Patient reports  having limited social support)   Patient Goals and CMS Choice        Expected Discharge Plan and Services Expected Discharge Plan: (pending) In-house Referral: Clinical Social Work     Living arrangements for the past 2 months: Apartment                                      Prior Living  Arrangements/Services Living arrangements for the past 2 months: Apartment Lives with:: Self Patient language and need for interpreter reviewed:: No Do you feel safe going back to the place where you live?: Yes(pattient states that he feels that he needs help navigating the hill outside of his apartment)      Need for Family Participation in Patient Care: Yes (Comment) Care giver support system in place?: (patient reports having no family or friends that he can reach out to for support but states that he is open to private duty care if needed)   Criminal Activity/Legal Involvement Pertinent to Current Situation/Hospitalization: No - Comment as needed  Activities of Daily Living Home Assistive Devices/Equipment: Gilford Rile (specify type) ADL Screening (condition at time of admission) Patient's cognitive ability adequate to safely complete daily activities?: Yes Is the patient deaf or have difficulty hearing?: No Does the patient have difficulty seeing, even when wearing glasses/contacts?: No Does the patient have difficulty concentrating, remembering, or making decisions?: No Patient able to express need for assistance with ADLs?: Yes Does the patient have difficulty dressing or bathing?: No Independently performs ADLs?: Yes (appropriate for developmental age) Does the patient have difficulty walking or climbing stairs?: Yes Weakness of Legs: Both Weakness of Arms/Hands: None  Permission Sought/Granted  Emotional Assessment Appearance:: Appears stated age Attitude/Demeanor/Rapport: Engaged, Gracious Affect (typically observed): Accepting, Adaptable Orientation: : Oriented to Self, Oriented to  Time, Oriented to Place, Oriented to Situation Alcohol / Substance Use: Not Applicable Psych Involvement: No (comment)  Admission diagnosis:  HNP (herniated nucleus pulposus), lumbar [M51.26] Weakness of both lower extremities [R29.898] Patient Active Problem List    Diagnosis Date Noted  . HNP (herniated nucleus pulposus), lumbar 01/13/2020   PCP:  System, Pcp Not In Pharmacy:   [facility] [address] [place], [place] - [address] [place] [address] Phone: [phone] Fax: [phone]     Social Determinants of Health (SDOH) Interventions    Readmission Risk Interventions No flowsheet data found.

## 2020-01-18 ENCOUNTER — Encounter: Payer: Self-pay | Admitting: Student

## 2020-01-18 MED ORDER — OXYCODONE HCL 5 MG PO TABS: 5.0000 mg | ORAL_TABLET | ORAL | 0 refills | Status: DC | PRN

## 2020-01-18 MED ORDER — OXYCODONE HCL ER 15 MG PO T12A: 15.0000 mg | EXTENDED_RELEASE_TABLET | Freq: Two times a day (BID) | ORAL | 0 refills | Status: DC

## 2020-01-18 NOTE — Progress Notes (Signed)
Physical Therapy Treatment Patient Details Name: Martin Suarez MRN: [id number] DOB: Nov 09, 1950 Today's Date: 01/18/2020    History of Present Illness Martin Suarez is well known to the practice just having had a three level lumbar discetomy on 5/25 for a left L3/4 laminectomy for disc, bilateral l4/5 discetomies, and a right L5/1laminectomy for disc herniation. Post op he did well immediately. However he called the doctor's office yesterday complaining of severe pain. He states he has fallen 4 times between yesterday and today. He comes to the ED due to difficulty walking and perceived weakness in the right lower extremity (hamstring). He is taking pain medication with some minor relief. Bowel and bladder continence is intact.    PT Comments    Pt was seen for mobility on hallway and then on stairwell with good control of LE's.  He described previous issues of pain and weakness on ankles which have resolved, now having more minor pain on R thigh laterally near the hip.  Centralizing discomfort, good response to therapy today.  MD will send pt to therapy when ready, but otherwise expecting dc home today.   Follow Up Recommendations  No PT follow up     Equipment Recommendations  Rolling walker with 5" wheels    Recommendations for Other Services       Precautions / Restrictions Precautions Precautions: Fall Precaution Comments: back pain and precautions Restrictions Weight Bearing Restrictions: No    Mobility  Bed Mobility Overal bed mobility: Modified Independent             General bed mobility comments: in chair when PT arrived  Transfers Overall transfer level: Needs assistance Equipment used: Rolling walker (2 wheeled) Transfers: Sit to/from Stand Sit to Stand: Supervision         General transfer comment: pt is remembering hand placement to stand and sit   Ambulation/Gait Ambulation/Gait assistance: Supervision Gait Distance (Feet): 300 Feet Assistive device: Rolling  walker (2 wheeled) Gait Pattern/deviations: Step-through pattern;Wide base of support;Decreased stride length Gait velocity: reduced Gait velocity interpretation: <1.31 ft/sec, indicative of household ambulator General Gait Details: reminders with RW for posture and safety of navigation of obstacles   Stairs Stairs: Yes Stairs assistance: Min guard Stair Management: One rail Right;Alternating pattern;Forwards Number of Stairs: 14 General stair comments: pt is demonstrating control of descent on stairs, no LOB or buckling observed   Wheelchair Mobility    Modified Rankin (Stroke Patients Only)       Balance Overall balance assessment: Needs assistance;History of Falls Sitting-balance support: Feet supported Sitting balance-Leahy Scale: Good     Standing balance support: Bilateral upper extremity supported;During functional activity Standing balance-Leahy Scale: Fair                              Cognition Arousal/Alertness: Awake/alert Behavior During Therapy: WFL for tasks assessed/performed Overall Cognitive Status: Within Functional Limits for tasks assessed                                        Exercises      General Comments General comments (skin integrity, edema, etc.): pt is asking to finish walking challenges, and note his ability to handle stairs.        Pertinent Vitals/Pain Pain Assessment: 0-10 Pain Score: 5  Pain Location: back Pain Descriptors / Indicators: Guarding Pain Intervention(s): Limited activity within  patient's tolerance;Monitored during session;Premedicated before session;Repositioned;Ice applied    Home Living                      Prior Function            PT Goals (current goals can now be found in the care plan section) Acute Rehab PT Goals Patient Stated Goal: get home and feel less back pain Progress towards PT goals: Progressing toward goals    Frequency    Min 3X/week      PT  Plan Current plan remains appropriate    Co-evaluation              AM-PAC PT "6 Clicks" Mobility   Outcome Measure  Help needed turning from your back to your side while in a flat bed without using bedrails?: None Help needed moving from lying on your back to sitting on the side of a flat bed without using bedrails?: None Help needed moving to and from a bed to a chair (including a wheelchair)?: None Help needed standing up from a chair using your arms (e.g., wheelchair or bedside chair)?: A Little Help needed to walk in hospital room?: A Little Help needed climbing 3-5 steps with a railing? : A Little 6 Click Score: 21    End of Session Equipment Utilized During Treatment: Gait belt Activity Tolerance: Patient tolerated treatment well;Patient limited by fatigue Patient left: in chair;with call bell/phone within reach Nurse Communication: Mobility status PT Visit Diagnosis: Other abnormalities of gait and mobility (R26.89);Pain Pain - Right/Left: (back) Pain - part of body: (back)     Time: 3570-1779 PT Time Calculation (min) (ACUTE ONLY): 28 min  Charges:  $Gait Training: 8-22 mins $Therapeutic Activity: 8-22 mins                    Ivar Drape 01/18/2020, 6:33 PM  Samul Dada, PT MS Acute Rehab Dept. Number: [facility] [id number] and [facility] [phone]

## 2020-01-18 NOTE — Care Management (Signed)
Ordered walker with Zack with [employer].   Ronny Flurry RN

## 2020-01-18 NOTE — Discharge Summary (Signed)
Physician Discharge Summary  Patient ID: Martin Suarez MRN: [id number] DOB/AGE: 13-Sep-1950 69 y.o.  Admit date: 01/13/2020 Discharge date: 01/18/2020  Admission Diagnoses:  Discharge Diagnoses:  Active Problems:   HNP (herniated nucleus pulposus), lumbar   Discharged Condition: good  Hospital Course: Patient admitted to the hospital for evaluation and treatment of a postoperative pain crisis.  He is status post multilevel lumbar decompressive surgery.  Patient presented with severe back and lower extremity pain.  MRI scan was obtained which demonstrated postoperative change at L3-4 and L4-5 on the left and at L4-5 and L5-S1 on the right.  There were changes of postoperative fluid collection which on my review is most consistent with postoperative fibrin sealant placement which is causing some compression upon the thecal sac.  There is no evidence of CSF leak or pseudomeningocele however.  His wound is clean and dry.  His pain is greatly improved over the past few days.  Currently he reports only some mild lumbar soreness.  Is having some occasional feelings of pain into his legs but he is standing ambulating and voiding well.  He feels ready for discharge home.  Consults:   Significant Diagnostic Studies:   Treatments:   Discharge Exam: Blood pressure (!) 135/95, pulse 87, temperature 98.4 F (36.9 C), temperature source Oral, resp. rate 18, SpO2 96 %. Patient is awake and alert.  He is oriented and appropriate.  His speech is fluent.  Judgment insight are intact.  Motor and sensory function of the extremities are normal.  Wound is clean dry and flat.  Chest and abdomen benign.    Disposition: Discharge disposition: 01-Home or Self Care        Allergies as of 01/18/2020      Reactions   Metoprolol Other (See Comments)   Pt does not recall       Medication List    TAKE these medications   aspirin EC 81 MG tablet Take 81 mg by mouth daily.   b complex vitamins tablet Take 1  tablet by mouth daily.   cyclobenzaprine 10 MG tablet Commonly known as: FLEXERIL Take 10 mg by mouth 2 (two) times daily as needed for muscle spasms.   finasteride 5 MG tablet Commonly known as: PROSCAR Take 5 mg by mouth daily.   FLUoxetine 20 MG capsule Commonly known as: PROZAC Take 40 mg by mouth at bedtime.   folic acid 1 MG tablet Commonly known as: FOLVITE Take 1 mg by mouth daily.   gabapentin 300 MG capsule Commonly known as: NEURONTIN Take 600-900 mg by mouth See admin instructions. 600mg  in he morning and 900mg  at bedtime   MAGNESIUM PO Take 1 tablet by mouth daily.   methylPREDNISolone 4 MG Tbpk tablet Commonly known as: MEDROL DOSEPAK Take 4-24 mg by mouth See admin instructions. see package; take 6 tablets and day 1, then, 5, 4, 3, 2, 1 on days 2 through 6.   mirtazapine 30 MG tablet Commonly known as: REMERON Take 30 mg by mouth at bedtime.   oxybutynin 10 MG 24 hr tablet Commonly known as: DITROPAN-XL Take 10 mg by mouth daily.   oxyCODONE 5 MG immediate release tablet Commonly known as: Oxy IR/ROXICODONE Take 10 mg by mouth every 6 (six) hours as needed for pain. What changed: Another medication with the same name was added. Make sure you understand how and when to take each.   oxyCODONE 15 mg 12 hr tablet Commonly known as: OXYCONTIN Take 1 tablet (15 mg total) by  mouth every 12 (twelve) hours. What changed: You were already taking a medication with the same name, and this prescription was added. Make sure you understand how and when to take each.   oxyCODONE 5 MG immediate release tablet Commonly known as: Oxy IR/ROXICODONE Take 1-2 tablets (5-10 mg total) by mouth every 4 (four) hours as needed for severe pain. What changed: You were already taking a medication with the same name, and this prescription was added. Make sure you understand how and when to take each.   pantoprazole 40 MG tablet Commonly known as: PROTONIX Take 40 mg by mouth  daily.   simvastatin 40 MG tablet Commonly known as: ZOCOR Take 20 mg by mouth daily.   valACYclovir 500 MG tablet Commonly known as: VALTREX Take 500 mg by mouth 2 (two) times daily.   Venlafaxine HCl 225 MG Tb24 Take 225 mg by mouth at bedtime.   VITAMIN D PO Take 1 capsule by mouth daily.        Signed: Cooper Render Ambry Dix 01/18/2020, 12:07 PM

## 2020-01-18 NOTE — Discharge Instructions (Signed)

## 2020-01-18 NOTE — Progress Notes (Signed)
Martin Suarez to be D/C'd per MD order. Discussed with the patient and all questions fully answered. ? VSS, Skin clean, dry and intact without evidence of skin break down, no evidence of skin tears noted. ? IV catheter discontinued intact. Site without signs and symptoms of complications. Dressing and pressure applied. ? An After Visit Summary was printed and given to the patient. Patient informed where to pickup prescriptions. ? D/C education completed with patient/family including follow up instructions, medication list, d/c activities limitations if indicated, with other d/c instructions as indicated by MD - patient able to verbalize understanding, all questions fully answered.  ? Patient instructed to return to ED, call 911, or call MD for any changes in condition.  ? Patient's home meds and belongings returned to patient.   Patient to be escorted via WC, and D/C home via private auto.

## 2020-03-12 ENCOUNTER — Emergency Department (HOSPITAL_COMMUNITY)
Admission: EM | Admit: 2020-03-12 | Discharge: 2020-03-13 | Disposition: A | Discharge status: Home / Self Care | Payer: No Typology Code available for payment source | Attending: Emergency Medicine | Admitting: Emergency Medicine

## 2020-03-12 ENCOUNTER — Other Ambulatory Visit: Payer: Self-pay

## 2020-03-12 ENCOUNTER — Emergency Department (HOSPITAL_COMMUNITY): Payer: No Typology Code available for payment source

## 2020-03-12 ENCOUNTER — Encounter (HOSPITAL_COMMUNITY): Payer: Self-pay

## 2020-03-12 DIAGNOSIS — R1033 Periumbilical pain: Secondary | ICD-10-CM | POA: Diagnosis not present

## 2020-03-12 DIAGNOSIS — R61 Generalized hyperhidrosis: Secondary | ICD-10-CM | POA: Diagnosis not present

## 2020-03-12 DIAGNOSIS — K219 Gastro-esophageal reflux disease without esophagitis: Secondary | ICD-10-CM | POA: Diagnosis not present

## 2020-03-12 DIAGNOSIS — Z20822 Contact with and (suspected) exposure to covid-19: Secondary | ICD-10-CM | POA: Diagnosis not present

## 2020-03-12 DIAGNOSIS — Z7982 Long term (current) use of aspirin: Secondary | ICD-10-CM | POA: Insufficient documentation

## 2020-03-12 DIAGNOSIS — R1084 Generalized abdominal pain: Secondary | ICD-10-CM

## 2020-03-12 LAB — COMPREHENSIVE METABOLIC PANEL
ALT: 35 U/L (ref 0–44)
AST: 36 U/L (ref 15–41)
Albumin: 4.7 g/dL (ref 3.5–5.0)
Alkaline Phosphatase: 91 U/L (ref 38–126)
Anion gap: 19 — ABNORMAL HIGH (ref 5–15)
BUN: 26 mg/dL — ABNORMAL HIGH (ref 8–23)
CO2: 18 mmol/L — ABNORMAL LOW (ref 22–32)
Calcium: 10.5 mg/dL — ABNORMAL HIGH (ref 8.9–10.3)
Chloride: 100 mmol/L (ref 98–111)
Creatinine, Ser: 1.25 mg/dL — ABNORMAL HIGH (ref 0.61–1.24)
GFR calc Af Amer: >60 mL/min (ref >60)
GFR calc non Af Amer: 58 mL/min — ABNORMAL LOW (ref >60)
Glucose, Bld: 142 mg/dL — ABNORMAL HIGH (ref 70–99)
Potassium: 3.5 mmol/L (ref 3.5–5.1)
Sodium: 137 mmol/L (ref 135–145)
Total Bilirubin: 0.8 mg/dL (ref 0.3–1.2)
Total Protein: 8.1 g/dL (ref 6.5–8.1)

## 2020-03-12 LAB — CBC
HCT: 45.3 % (ref 39.0–52.0)
Hemoglobin: 15.4 g/dL (ref 13.0–17.0)
MCH: 31.2 pg (ref 26.0–34.0)
MCHC: 34 g/dL (ref 30.0–36.0)
MCV: 91.7 fL (ref 80.0–100.0)
Platelets: 334 10*3/uL (ref 150–400)
RBC: 4.94 MIL/uL (ref 4.22–5.81)
RDW: 12.6 % (ref 11.5–15.5)
WBC: 13.4 10*3/uL — ABNORMAL HIGH (ref 4.0–10.5)
nRBC: 0 % (ref 0.0–0.2)

## 2020-03-12 LAB — SARS CORONAVIRUS 2 BY RT PCR (HOSPITAL ORDER, PERFORMED IN ~~LOC~~ HOSPITAL LAB): SARS Coronavirus 2: NEGATIVE

## 2020-03-12 LAB — LIPASE, BLOOD: Lipase: 36 U/L (ref 11–51)

## 2020-03-12 MED ORDER — HYDROMORPHONE HCL 1 MG/ML IJ SOLN
1.0000 mg | Freq: Once | INTRAMUSCULAR | Status: AC
  Administered 2020-03-12: 1 mg via INTRAVENOUS
  Filled 2020-03-12: qty 1

## 2020-03-12 MED ORDER — SODIUM CHLORIDE 0.9 % IV SOLN: INTRAVENOUS | Status: DC

## 2020-03-12 MED ORDER — SODIUM CHLORIDE 0.9 % IV BOLUS
500.0000 mL | Freq: Once | INTRAVENOUS | Status: AC
  Administered 2020-03-12: 500 mL via INTRAVENOUS

## 2020-03-12 MED ORDER — SODIUM CHLORIDE (PF) 0.9 % IJ SOLN
INTRAMUSCULAR | Status: AC
  Filled 2020-03-12: qty 50

## 2020-03-12 MED ORDER — IOHEXOL 300 MG/ML  SOLN
100.0000 mL | Freq: Once | INTRAMUSCULAR | Status: AC | PRN
  Administered 2020-03-12: 100 mL via INTRAVENOUS

## 2020-03-12 MED ORDER — ONDANSETRON HCL 4 MG/2ML IJ SOLN
4.0000 mg | Freq: Once | INTRAMUSCULAR | Status: AC
  Administered 2020-03-12: 4 mg via INTRAVENOUS
  Filled 2020-03-12: qty 2

## 2020-03-12 NOTE — Discharge Instructions (Addendum)
Continue your medications that you have at home for pain. Make an appointment to follow-up with your doctor. Please follow-up with Dr. Dutch Quint about the pain in your legs. Your tests tonight did not show a reason for your abdominal pain.

## 2020-03-12 NOTE — ED Notes (Signed)
Pt is unable to provide a urine sample at this time.

## 2020-03-12 NOTE — ED Notes (Signed)
Pt is aware that a urine specimen is needed. He has a urinal by bedside and will call when ready.

## 2020-03-12 NOTE — ED Triage Notes (Signed)
Patient reports abdominal pain x3/4 days and reports chills at night. Patient states pain is 10/10

## 2020-03-12 NOTE — ED Provider Notes (Signed)
[facility]-EMERGENCY DEPT Provider Note   CSN: [id number] Arrival date & time: 03/12/20  1554     History Chief Complaint  Patient presents with  . Abdominal Pain    Martin Suarez is a 69 y.o. male.  Patient with a history of periumbilical abdominal pain for 3 to 4 days.  Decreased appetite.  No nausea no vomiting no diarrhea.  Patient with prior history of Nissen fundoplication many years ago.  And also more recently did have some mild lower back surgery.  Patient denies any chest pain or shortness of breath does report chills at night.  Patient states on the pain is the area is 10 out of 10.  Somewhat intermittent.  Denies any hematuria or dysuria.  Patient's had both Covid vaccines.  Patient states that he needed several blankets in the waiting room to stay warm.  And then he was sweaty.        Past Medical History:  Diagnosis Date  . GERD (gastroesophageal reflux disease)   . High cholesterol   . Low testosterone   . Syncope     Patient Active Problem List   Diagnosis Date Noted  . HNP (herniated nucleus pulposus), lumbar 01/13/2020    Past Surgical History:  Procedure Laterality Date  . BACK SURGERY    . NISSEN FUNDOPLICATION         No family history on file.  Social History   Tobacco Use  . Smoking status: Never Smoker  . Smokeless tobacco: Never Used  Vaping Use  . Vaping Use: Never used  Substance Use Topics  . Alcohol use: No  . Drug use: Yes    Types: Marijuana    Home Medications Prior to Admission medications   Medication Sig Start Date End Date Taking? Authorizing Provider  aspirin EC 81 MG tablet Take 81 mg by mouth daily.    [provider]  b complex vitamins tablet Take 1 tablet by mouth daily.    [provider]  Cholecalciferol (VITAMIN D PO) Take 1 capsule by mouth daily.    [provider]  cyclobenzaprine (FLEXERIL) 10 MG tablet Take 10 mg by mouth 2 (two) times daily as needed for  muscle spasms.    [provider]  finasteride (PROSCAR) 5 MG tablet Take 5 mg by mouth daily.    [provider]  FLUoxetine (PROZAC) 20 MG capsule Take 40 mg by mouth at bedtime. 05/11/19   [provider]  folic acid (FOLVITE) 1 MG tablet Take 1 mg by mouth daily.    [provider]  gabapentin (NEURONTIN) 300 MG capsule Take 600-900 mg by mouth See admin instructions. 600mg  in he morning and 900mg  at bedtime 01/16/19   [provider]  MAGNESIUM PO Take 1 tablet by mouth daily.    [provider]  methylPREDNISolone (MEDROL DOSEPAK) 4 MG TBPK tablet Take 4-24 mg by mouth See admin instructions. see package; take 6 tablets and day 1, then, 5, 4, 3, 2, 1 on days 2 through 6. 01/07/20   [provider]  mirtazapine (REMERON) 30 MG tablet Take 30 mg by mouth at bedtime.    [provider]  oxybutynin (DITROPAN-XL) 10 MG 24 hr tablet Take 10 mg by mouth daily. 06/01/19   [provider]  oxyCODONE (OXY IR/ROXICODONE) 5 MG immediate release tablet Take 10 mg by mouth every 6 (six) hours as needed for pain. 01/10/20   [provider]  oxyCODONE (OXY IR/ROXICODONE) 5  MG immediate release tablet Take 1-2 tablets (5-10 mg total) by mouth every 4 (four) hours as needed for severe pain. 01/18/20   Julio SicksPool, Henry, MD  oxyCODONE (OXYCONTIN) 15 mg 12 hr tablet Take 1 tablet (15 mg total) by mouth every 12 (twelve) hours. 01/18/20   Julio SicksPool, Henry, MD  pantoprazole (PROTONIX) 40 MG tablet Take 40 mg by mouth daily.    [provider]  simvastatin (ZOCOR) 40 MG tablet Take 20 mg by mouth daily.     [provider]  valACYclovir (VALTREX) 500 MG tablet Take 500 mg by mouth 2 (two) times daily.    [provider]  Venlafaxine HCl 225 MG TB24 Take 225 mg by mouth at bedtime.    [provider]    Allergies    Metoprolol  Review of Systems   Review of Systems  Constitutional: Positive for appetite  change and diaphoresis. Negative for chills and fever.  HENT: Negative for rhinorrhea and sore throat.   Eyes: Negative for visual disturbance.  Respiratory: Negative for cough and shortness of breath.   Cardiovascular: Negative for chest pain and leg swelling.  Gastrointestinal: Positive for abdominal pain. Negative for diarrhea, nausea and vomiting.  Genitourinary: Negative for dysuria.  Musculoskeletal: Negative for back pain and neck pain.  Skin: Negative for rash.  Neurological: Negative for dizziness, light-headedness and headaches.  Hematological: Does not bruise/bleed easily.  Psychiatric/Behavioral: Negative for confusion.    Physical Exam Updated Vital Signs BP (!) 137/100 (BP Location: Left Arm)   Pulse 84   Temp 98.9 F (37.2 C) (Oral)   Resp 16   Ht 1.829 m (6')   Wt 90.7 kg   SpO2 98%   BMI 27.12 kg/m   Physical Exam Vitals and nursing note reviewed.  Constitutional:      Appearance: Normal appearance. He is well-developed.  HENT:     Head: Normocephalic and atraumatic.  Eyes:     Extraocular Movements: Extraocular movements intact.     Conjunctiva/sclera: Conjunctivae normal.     Pupils: Pupils are equal, round, and reactive to light.  Cardiovascular:     Rate and Rhythm: Normal rate and regular rhythm.     Heart sounds: No murmur heard.   Pulmonary:     Effort: Pulmonary effort is normal. No respiratory distress.     Breath sounds: Normal breath sounds.  Abdominal:     General: There is no distension.     Palpations: Abdomen is soft.     Tenderness: There is no abdominal tenderness. There is no guarding.     Comments: Abdomen soft abdomen soft and nontender to palpation.  Musculoskeletal:        General: No swelling.     Cervical back: Normal range of motion and neck supple.  Skin:    General: Skin is warm and dry.     Capillary Refill: Capillary refill takes less than 2 seconds.  Neurological:     General: No focal deficit present.     Mental  Status: He is alert and oriented to person, place, and time.     Cranial Nerves: No cranial nerve deficit.     Sensory: No sensory deficit.     Motor: No weakness.     ED Results / Procedures / Treatments   Labs (all labs ordered are listed, but only abnormal results are displayed) Labs Reviewed  COMPREHENSIVE METABOLIC PANEL - Abnormal; Notable for the following components:      Result Value  CO2 18 (*)    Glucose, Bld 142 (*)    BUN 26 (*)    Creatinine, Ser 1.25 (*)    Calcium 10.5 (*)    GFR calc non Af Amer 58 (*)    Anion gap 19 (*)    All other components within normal limits  CBC - Abnormal; Notable for the following components:   WBC 13.4 (*)    All other components within normal limits  SARS CORONAVIRUS 2 BY RT PCR (HOSPITAL ORDER, PERFORMED IN [facility] LAB)  LIPASE, BLOOD  URINALYSIS, ROUTINE W REFLEX MICROSCOPIC    EKG EKG Interpretation  Date/Time:  Saturday March 12 2020 21:31:29 EDT Ventricular Rate:  72 PR Interval:    QRS Duration: 125 QT Interval:  416 QTC Calculation: 456 R Axis:   -49 Text Interpretation: Sinus rhythm RBBB and LAFB Confirmed by Vanetta Mulders [id number]) on 03/12/2020 9:39:21 PM   Radiology CT Abdomen Pelvis W Contrast  Result Date: 03/12/2020 CLINICAL DATA:  Abdominal pain.  Chills. Abdominal abscess/infection suspected EXAM: CT ABDOMEN AND PELVIS WITH CONTRAST TECHNIQUE: Multidetector CT imaging of the abdomen and pelvis was performed using the standard protocol following bolus administration of intravenous contrast. CONTRAST:  OMNIPAQUE IOHEXOL 300 MG/ML  SOLN COMPARISON:  None. FINDINGS: Lower chest: No acute findings. Heart is normal in size. Small to moderate hiatal hernia with mild distal esophageal wall thickening. Hepatobiliary: Mild diffusely decreased hepatic density consistent with steatosis. No focal hepatic lesion. Gallbladder physiologically distended, no calcified stone. No biliary dilatation. Pancreas:  No ductal dilatation or inflammation. Spleen: Normal in size without focal abnormality. Adrenals/Urinary Tract: No adrenal nodule. Multiple nonobstructing stones in the left kidney. No hydronephrosis or perinephric edema. Homogeneous renal enhancement with symmetric excretion on delayed phase imaging. Urinary bladder is thick walled. Stomach/Bowel: Surgical clips adjacent to the gastroesophageal junction. Small to moderate hiatal hernia with mild wall thickening of the distal esophagus. The stomach is otherwise unremarkable. Normal positioning of the duodenum and ligament of Treitz. There is no small bowel obstruction or inflammatory change. No small bowel wall thickening. Normal appendix. Mild colonic diverticulosis involving the descending and sigmoid colon without evidence of diverticulitis. No colonic wall thickening or inflammation. Vascular/Lymphatic: Minor aortic atherosclerosis. No aortic aneurysm. Patent portal vein. Mesenteric vessels are patent. No enlarged lymph nodes in the abdomen or pelvis. Reproductive: Normal sized prostate gland with central prostatic calcifications. Other: No free air, free fluid, or intra-abdominal fluid collection. Fat in both inguinal canals. 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 There are no acute or suspicious osseous abnormalities. IMPRESSION: 1. Bladder wall thickening, can be seen with cystitis. Recommend correlation with urinalysis. 2. Post Nissen with surgical clips at the gastroesophageal junction. Small to moderate hiatal hernia with mild distal esophageal wall thickening. 3. Mild hepatic steatosis. 4. Nonobstructing left nephrolithiasis. 5. Mild colonic diverticulosis without diverticulitis. Aortic Atherosclerosis (ICD10-I70.0). Electronically Signed   By: Narda Rutherford M.D.   On: 03/12/2020 22:58   DG Chest Port 1 View  Result Date: 03/12/2020 CLINICAL DATA:  Night sweats, abdominal pain and chills EXAM: PORTABLE CHEST 1 VIEW  COMPARISON:  Radiograph 08/09/2010 FINDINGS: No consolidation, features of edema, pneumothorax, or effusion. Pulmonary vascularity is normally distributed. The cardiomediastinal contours are unremarkable. No acute osseous or soft tissue abnormality. Mild degenerative changes in the shoulders and spine. Telemetry leads overlie the chest. IMPRESSION: No acute cardiopulmonary abnormality. Electronically Signed   By: Kreg Shropshire M.D.   On: 03/12/2020 21:57    Procedures  Procedures (including critical care time)  Medications Ordered in ED Medications  0.9 %  sodium chloride infusion ( Intravenous New Bag/Given 03/12/20 2242)  sodium chloride (PF) 0.9 % injection (has no administration in time range)  sodium chloride 0.9 % bolus 500 mL (0 mLs Intravenous Stopped 03/12/20 2242)  iohexol (OMNIPAQUE) 300 MG/ML solution 100 mL (100 mLs Intravenous Contrast Given 03/12/20 2227)  ondansetron (ZOFRAN) injection 4 mg (4 mg Intravenous Given 03/12/20 2314)  HYDROmorphone (DILAUDID) injection 1 mg (1 mg Intravenous Given 03/12/20 2314)    ED Course  I have reviewed the triage vital signs and the nursing notes.  Pertinent labs & imaging results that were available during my care of the patient were reviewed by me and considered in my medical decision making (see chart for details).    MDM Rules/Calculators/A&P                          Patient's work-up here no fever. Not tachycardic not hypotensive oxygen level 100% on room air. Abdomen soft and nontender. But patient states he has some pain below the umbilical area.  Mild leukocytosis. Electrolytes significant for CO2 of 18. BUN and creatinine is pretty much baseline although BUN is a little elevated. But his GFR is her baseline for him. Liver function test without too much of a significant abnormality. Little bit of anion gap. This reason why patient was given 500 cc bolus and 75 cc an hour.  In addition due to patient feeling cold. Decreased appetite and  some diaphoresis out in the waiting area did order the Covid test. Patient's had both vaccines.  CT scan shows little bit inflammation in the distal esophagus above the Nissen fundoplication. No other acute abdominal findings does show some evidence of some bladder inflammation so therefore urinalysis is important to check clinically. Patient has not provided urine yet.  In addition while waiting for urinalysis results will go ahead and give another 500 cc bolus normal saline.  Patient not toxic no acute distress.  Urinalysis needs to be checked Covid test needs to be rechecked. Final Clinical Impression(s) / ED Diagnoses Final diagnoses:  Periumbilical abdominal pain    Rx / DC Orders ED Discharge Orders    None       Vanetta Mulders, MD 03/12/20 2317

## 2020-03-13 LAB — URINALYSIS, ROUTINE W REFLEX MICROSCOPIC
Bilirubin Urine: NEGATIVE
Glucose, UA: NEGATIVE mg/dL
Hgb urine dipstick: NEGATIVE
Ketones, ur: 5 mg/dL — AB
Leukocytes,Ua: NEGATIVE
Nitrite: NEGATIVE
Protein, ur: NEGATIVE mg/dL
Specific Gravity, Urine: <1.005 — ABNORMAL LOW (ref 1.005–1.030)
pH: 6 (ref 5.0–8.0)

## 2020-03-13 LAB — BASIC METABOLIC PANEL
Anion gap: 6 (ref 5–15)
BUN: 23 mg/dL (ref 8–23)
CO2: 21 mmol/L — ABNORMAL LOW (ref 22–32)
Calcium: 7.7 mg/dL — ABNORMAL LOW (ref 8.9–10.3)
Chloride: 109 mmol/L (ref 98–111)
Creatinine, Ser: 0.93 mg/dL (ref 0.61–1.24)
GFR calc Af Amer: >60 mL/min (ref >60)
GFR calc non Af Amer: >60 mL/min (ref >60)
Glucose, Bld: 105 mg/dL — ABNORMAL HIGH (ref 70–99)
Potassium: 3.2 mmol/L — ABNORMAL LOW (ref 3.5–5.1)
Sodium: 136 mmol/L (ref 135–145)

## 2020-03-13 MED ORDER — OXYCODONE-ACETAMINOPHEN 5-325 MG PO TABS
1.0000 | ORAL_TABLET | Freq: Once | ORAL | Status: AC
  Administered 2020-03-13: 1 via ORAL
  Filled 2020-03-13: qty 1

## 2020-03-13 MED ORDER — SODIUM CHLORIDE 0.9 % IV BOLUS
500.0000 mL | Freq: Once | INTRAVENOUS | Status: AC
  Administered 2020-03-13: 500 mL via INTRAVENOUS

## 2020-03-13 NOTE — ED Notes (Signed)
Patient is resting comfortably. 

## 2020-03-13 NOTE — ED Notes (Signed)
Patient ambulatory to the lobby with a steady gait and belongings. 

## 2020-03-13 NOTE — ED Provider Notes (Signed)
Patient was left at change of shift to get the results of his Covid test and his urinalysis.  It took patient a long time.  Produce a sample.  I did give him an extra 500 cc of fluids to facilitate that.  Nursing staff has showed me his urine sample prior to sending to the lab and it was very cloudy.   Recheck before discharge, patient has been asking for pain medication.  He states his abdominal pain is gone however when he laid on the CT scanner he now has pain in both buttocks radiating down his legs.  He states he had back surgery 2 to 3 months ago by Dr. Dutch Quint.  He states he did have pain in his legs before that surgery.  Patient states he takes oxycodone 10 however when I review the [place] he only gets 5 mg tablets.  They were last filled July 13.  He got 60 tablets.  He states he still has pills at home.  He was advised to follow-up with Dr. Dutch Quint again and he has pain tablets he can take at home.  Results for orders placed or performed during the hospital encounter of 03/12/20  SARS Coronavirus 2 by RT PCR (hospital order, performed in [facility] hospital lab) Nasopharyngeal Nasopharyngeal Swab   Specimen: Nasopharyngeal Swab  Result Value Ref Range   SARS Coronavirus 2 NEGATIVE NEGATIVE  Lipase, blood  Result Value Ref Range   Lipase 36 11 - 51 U/L  Comprehensive metabolic panel  Result Value Ref Range   Sodium 137 135 - 145 mmol/L   Potassium 3.5 3.5 - 5.1 mmol/L   Chloride 100 98 - 111 mmol/L   CO2 18 (L) 22 - 32 mmol/L   Glucose, Bld 142 (H) 70 - 99 mg/dL   BUN 26 (H) 8 - 23 mg/dL   Creatinine, Ser 9.67 (H) 0.61 - 1.24 mg/dL   Calcium 59.1 (H) 8.9 - 10.3 mg/dL   Total Protein 8.1 6.5 - 8.1 g/dL   Albumin 4.7 3.5 - 5.0 g/dL   AST 36 15 - 41 U/L   ALT 35 0 - 44 U/L   Alkaline Phosphatase 91 38 - 126 U/L   Total Bilirubin 0.8 0.3 - 1.2 mg/dL   GFR calc non Af Amer 58 (L) >60 mL/min   GFR calc Af Amer >60 >60 mL/min   Anion gap 19 (H) 5 - 15  CBC  Result  Value Ref Range   WBC 13.4 (H) 4.0 - 10.5 K/uL   RBC 4.94 4.22 - 5.81 MIL/uL   Hemoglobin 15.4 13.0 - 17.0 g/dL   HCT 63.8 39 - 52 %   MCV 91.7 80.0 - 100.0 fL   MCH 31.2 26.0 - 34.0 pg   MCHC 34.0 30.0 - 36.0 g/dL   RDW 46.6 59.9 - 35.7 %   Platelets 334 150 - 400 K/uL   nRBC 0.0 0.0 - 0.2 %  Urinalysis, Routine w reflex microscopic  Result Value Ref Range   Color, Urine YELLOW YELLOW   APPearance CLEAR CLEAR   Specific Gravity, Urine <1.005 (L) 1.005 - 1.030   pH 6.0 5.0 - 8.0   Glucose, UA NEGATIVE NEGATIVE mg/dL   Hgb urine dipstick NEGATIVE NEGATIVE   Bilirubin Urine NEGATIVE NEGATIVE   Ketones, ur 5 (A) NEGATIVE mg/dL   Protein, ur NEGATIVE NEGATIVE mg/dL   Nitrite NEGATIVE NEGATIVE   Leukocytes,Ua NEGATIVE NEGATIVE    Laboratory interpretation all normal except leukocytosis, renal insufficiency,  mild hypercalcemia without having a low total protein, elevated anion gap   CT Abdomen Pelvis W Contrast  Result Date: 03/12/2020 CLINICAL DATA:  Abdominal pain.  Chills. Abdominal abscess/infection suspected EXAM: CT ABDOMEN AND PELVIS WITH CONTRAST TECHNIQUE: Multidetector CT imaging of the abdomen and pelvis was performed using the standard protocol following bolus administration of intravenous contrast. CONTRAST:  OMNIPAQUE IOHEXOL 300 MG/ML  SOLN COMPARISON:  None. FINDINGS: Lower chest: No acute findings. Heart is normal in size. Small to moderate hiatal hernia with mild distal esophageal wall thickening. Hepatobiliary: Mild diffusely decreased hepatic density consistent with steatosis. No focal hepatic lesion. Gallbladder physiologically distended, no calcified stone. No biliary dilatation. Pancreas: No ductal dilatation or inflammation. Spleen: Normal in size without focal abnormality. Adrenals/Urinary Tract: No adrenal nodule. Multiple nonobstructing stones in the left kidney. No hydronephrosis or perinephric edema. Homogeneous renal enhancement with symmetric excretion on  delayed phase imaging. Urinary bladder is thick walled. Stomach/Bowel: Surgical clips adjacent to the gastroesophageal junction. Small to moderate hiatal hernia with mild wall thickening of the distal esophagus. The stomach is otherwise unremarkable. Normal positioning of the duodenum and ligament of Treitz. There is no small bowel obstruction or inflammatory change. No small bowel wall thickening. Normal appendix. Mild colonic diverticulosis involving the descending and sigmoid colon without evidence of diverticulitis. No colonic wall thickening or inflammation. Vascular/Lymphatic: Minor aortic atherosclerosis. No aortic aneurysm. Patent portal vein. Mesenteric vessels are patent. No enlarged lymph nodes in the abdomen or pelvis. Reproductive: Normal sized prostate gland with central prostatic calcifications. Other: No free air, free fluid, or intra-abdominal fluid collection. Fat in both inguinal canals. 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 There are no acute or suspicious osseous abnormalities. IMPRESSION: 1. Bladder wall thickening, can be seen with cystitis. Recommend correlation with urinalysis. 2. Post Nissen with surgical clips at the gastroesophageal junction. Small to moderate hiatal hernia with mild distal esophageal wall thickening. 3. Mild hepatic steatosis. 4. Nonobstructing left nephrolithiasis. 5. Mild colonic diverticulosis without diverticulitis. Aortic Atherosclerosis (ICD10-I70.0). Electronically Signed   By: Narda Rutherford M.D.   On: 03/12/2020 22:58   DG Chest Port 1 View  Result Date: 03/12/2020 CLINICAL DATA:  Night sweats, abdominal pain and chills EXAM: PORTABLE CHEST 1 VIEW COMPARISON:  Radiograph 08/09/2010 FINDINGS: No consolidation, features of edema, pneumothorax, or effusion. Pulmonary vascularity is normally distributed. The cardiomediastinal contours are unremarkable. No acute osseous or soft tissue abnormality. Mild degenerative changes in the  shoulders and spine. Telemetry leads overlie the chest. IMPRESSION: No acute cardiopulmonary abnormality. Electronically Signed   By: Kreg Shropshire M.D.   On: 03/12/2020 21:57      Devoria Albe, MD 03/13/20 [phone]

## 2020-06-07 ENCOUNTER — Telehealth: Payer: Self-pay | Admitting: Gastroenterology

## 2020-06-14 NOTE — Telephone Encounter (Signed)
Records received and reviewed over 35 pages which were notable for the following:  Seen by PCM on 03/30/2020 and note notable for the following: 69 year old male presenting for abdominal pain.  Pain in the upper abdomen/epigastrium x3 months described as severe, sharp and stabbing.  Has worsened over time.  Seen in [facility] ER on 7/31 for the same.  CT and labs were done but "they could not find anything wrong with me". -CBC, BMP, UA, lipase unremarkable except for mildly increased WBCs -CT with bladder wall thickening, can be seen with cystitis.  Post Nissen with surgical clips at GE junction, small to moderate hiatal hernia with mild distal esophageal wall thickening.  Mild hepatic steatosis, nonobstructing left nephrolithiasis, colonic diverticulosis without diverticulitis. Patient has a history of peptic ulcer disease and Nissen fundoplication 1986.  He is on a PPI.  Denies heartburn, reflux, hematemesis, BRBPR, melena.  Negative fit test in 09/2019. History of lumbar decompression by Dr. Dutch Quint on 01/09/2020.  Back pain has improved but has pain in both legs. Does see mental health for history of depression for many years.  -Nissen fundoplication 1986.  Slept on CT in 2017.  Treated with PPI twice daily -EGD 2010: LA Grade B esophagitis -EGD 2017: LA Grade A esophagitis with loose wrap  -History of alcoholic hepatitis.  Now 5 drinks/week.  Smokes cannabis regularly since 1980s. Referral to [facility] for evaluation of abdominal pain and weight loss.  History of peptic ulcer disease and Nissen fundoplication.  Last EGD in 2010.  Has lost 40 pounds since 09/2018.  Was reportedly seen at [facility] ER at some point in the interim.  Medications: -Artificial tears -Fluoxetine 20 mg, 3 capsules daily -Gabapentin 300 mg, 2 capsules in the morning and 3 capsules at bedtime -Hydroxyzine pamoate 25 mg twice daily as needed for anxiety -Ibuprofen 800 mg as needed  daily -Mirtazapine 45 mg nightly -Pantoprazole 40 mg twice daily -Finasteride 5 mg daily -Methocarbamol 500 mg 3 times a day as needed for muscle spasm -Oxybutynin chloride 10 mg daily -Simvastatin 40 mg, take 1/2 tablet daily -Tamsulosin 0.4 mg twice daily  -Placed GI referral for evaluation and possible EGD and colonoscopy by PCM on 03/30/2020.   Labs from 06/09/2019: -H/H 14.4/42.2, PLT 294, WBC 13.1 -Creatinine 1.14   Records reviewed as above.  Okay to schedule OV next available for evaluation of abdominal pain, weight loss, GERD

## 2020-06-20 ENCOUNTER — Encounter: Payer: Self-pay | Admitting: Physician Assistant

## 2020-07-04 ENCOUNTER — Encounter: Payer: Self-pay | Admitting: Physician Assistant

## 2020-07-04 ENCOUNTER — Other Ambulatory Visit (INDEPENDENT_AMBULATORY_CARE_PROVIDER_SITE_OTHER): Payer: Medicare HMO

## 2020-07-04 ENCOUNTER — Ambulatory Visit: Payer: Medicare HMO | Admitting: Physician Assistant

## 2020-07-04 VITALS — BP 134/90 | HR 84 | Ht 70.5 in | Wt 189.1 lb

## 2020-07-04 DIAGNOSIS — K219 Gastro-esophageal reflux disease without esophagitis: Secondary | ICD-10-CM | POA: Diagnosis not present

## 2020-07-04 DIAGNOSIS — R634 Abnormal weight loss: Secondary | ICD-10-CM | POA: Diagnosis not present

## 2020-07-04 DIAGNOSIS — R109 Unspecified abdominal pain: Secondary | ICD-10-CM

## 2020-07-04 DIAGNOSIS — R630 Anorexia: Secondary | ICD-10-CM | POA: Diagnosis not present

## 2020-07-04 LAB — COMPREHENSIVE METABOLIC PANEL
ALT: 31 U/L (ref 0–53)
AST: 31 U/L (ref 0–37)
Albumin: 4.5 g/dL (ref 3.5–5.2)
Alkaline Phosphatase: 92 U/L (ref 39–117)
BUN: 19 mg/dL (ref 6–23)
CO2: 26 mEq/L (ref 19–32)
Calcium: 9.7 mg/dL (ref 8.4–10.5)
Chloride: 102 mEq/L (ref 96–112)
Creatinine, Ser: 1.05 mg/dL (ref 0.40–1.50)
GFR: 71.72 mL/min (ref >60.00)
Glucose, Bld: 140 mg/dL — ABNORMAL HIGH (ref 70–99)
Potassium: 4.2 mEq/L (ref 3.5–5.1)
Sodium: 138 mEq/L (ref 135–145)
Total Bilirubin: 0.6 mg/dL (ref 0.2–1.2)
Total Protein: 7.3 g/dL (ref 6.0–8.3)

## 2020-07-04 LAB — CBC WITH DIFFERENTIAL/PLATELET
Basophils Absolute: 0.1 10*3/uL (ref 0.0–0.1)
Basophils Relative: 0.5 % (ref 0.0–3.0)
Eosinophils Absolute: 0.3 10*3/uL (ref 0.0–0.7)
Eosinophils Relative: 3 % (ref 0.0–5.0)
HCT: 42.4 % (ref 39.0–52.0)
Hemoglobin: 14.6 g/dL (ref 13.0–17.0)
Lymphocytes Relative: 16.7 % (ref 12.0–46.0)
Lymphs Abs: 1.7 10*3/uL (ref 0.7–4.0)
MCHC: 34.4 g/dL (ref 30.0–36.0)
MCV: 93.3 fl (ref 78.0–100.0)
Monocytes Absolute: 0.6 10*3/uL (ref 0.1–1.0)
Monocytes Relative: 6.2 % (ref 3.0–12.0)
Neutro Abs: 7.3 10*3/uL (ref 1.4–7.7)
Neutrophils Relative %: 73.6 % (ref 43.0–77.0)
Platelets: 309 10*3/uL (ref 150.0–400.0)
RBC: 4.55 Mil/uL (ref 4.22–5.81)
RDW: 14.9 % (ref 11.5–15.5)
WBC: 9.9 10*3/uL (ref 4.0–10.5)

## 2020-07-04 MED ORDER — NA SULFATE-K SULFATE-MG SULF 17.5-3.13-1.6 GM/177ML PO SOLN
1.0000 | Freq: Once | ORAL | 0 refills | Status: AC
Start: 2020-07-04 — End: 2020-07-04

## 2020-07-04 NOTE — Progress Notes (Signed)
Subjective:    Patient ID: Martin Suarez, male    DOB: 1951/01/11, 69 y.o.   MRN: [id number]  HPI  Martin Suarez is a 69 year old white male, new to GI today referred by the [facility] for possible EGD and colonoscopy. He had initially been seen by his primary with complaints of abdominal pain in August 2021.  He did undergo CT of the abdomen and pelvis at that time during ER visit on 03/12/2020 that showed mild increased hepatic density consistent with steatosis, distended gallbladder without gallstones and nonobstructing kidney stones in the left kidney.  Urinary bladder was felt to be thick-walled.  There were surgical clips adjacent to the GE junction and a small to moderate hiatal hernia with mild wall thickening of the distal esophagus was noted, as well as diverticulosis without diverticulitis. Patient says that all of his current symptoms started after he underwent a lumbar laminectomy in May 2021.  He says that the surgery failed and he had horrible back pain for about 4 months afterwards including radiation of pain bilaterally down into both of his legs.  He reports that he did not get much advice from his surgeon, eventually sought care through acupuncturist which was not beneficial.  He has since been established with a different acupuncturist and says his pain has decreased about 90% and he is doing much better from that standpoint.  Around that time he had been taking some oxycodone for pain, had lost a total of 38 pounds which he attributes to significant back pain and secondary lack of appetite.  He had been taking some NSAIDs but not on a regular basis.  He did have 1 weekend by his report of fairly excruciating abdominal pain which is when he underwent the CT scan and labs.  Labs at that time included CBC c-Met and lipase all of which were unrevealing.  Since then he has not been having any significant ongoing abdominal pain.  He reports that his bowel movements have been fairly regular dark at  times though no obvious melena or hematochezia.  His appetite is still off and he will occasionally have an episode of vomiting the last one was about 2 weeks ago.  He does feel that his weight has leveled off and he is no longer losing. He does smoke marijuana on a daily basis and says that he is drinking 3-4 shots of alcohol per day currently and had been drinking more than that over the previous months. He reports ongoing problems with depression. He has remote history of peptic ulcer disease, he does not recall when he may have had a colonoscopy but says it was many years ago. He is currently on Protonix 40 mg p.o. twice daily for GERD. He had had prior EGD in 2010 with finding of grade a esophagitis and a loosened wrap.  Review of Systems Pertinent positive and negative review of systems were noted in the above HPI section.  All other review of systems was otherwise negative.  Outpatient Encounter Medications as of 07/04/2020  Medication Sig  . Ascorbic Acid (VITA-C PO) Take 1 tablet by mouth daily.  Marland Kitchen b complex vitamins tablet Take 1 tablet by mouth daily.  . Cholecalciferol (VITAMIN D PO) Take 1 capsule by mouth daily.  . diphenhydrAMINE (BENADRYL) 25 MG tablet Take 25-75 mg by mouth every 6 (six) hours as needed for itching or allergies.  . finasteride (PROSCAR) 5 MG tablet Take 5 mg by mouth daily.  Marland Kitchen FLUoxetine (PROZAC) 20 MG capsule  Take 20-40 mg by mouth See admin instructions. 40 mg in the am and 20 mg in the pm  . gabapentin (NEURONTIN) 300 MG capsule Take 600-900 mg by mouth See admin instructions. 636m in he morning and 9036mat bedtime  . Glucosamine-Chondroitin (OSTEO BI-FLEX REGULAR STRENGTH) 250-200 MG TABS Take 1 tablet by mouth in the morning and at bedtime.  . hydroxypropyl methylcellulose / hypromellose (ISOPTO TEARS / GONIOVISC) 2.5 % ophthalmic solution Place 1 drop into both eyes 3 (three) times daily as needed for dry eyes.  . Marland Kitchenbuprofen (ADVIL) 800 MG tablet Take 800  mg by mouth every 8 (eight) hours as needed for fever, headache, mild pain, moderate pain or cramping.  . Marland KitchenAGNESIUM PO Take 1 tablet by mouth daily.  . Melatonin 10 MG TABS Take 30 mg by mouth at bedtime.  . methocarbamol (ROBAXIN) 750 MG tablet Take 750 mg by mouth 4 (four) times daily as needed for muscle spasms.  . mirtazapine (REMERON) 45 MG tablet Take 45 mg by mouth at bedtime.  . Multiple Vitamins-Minerals (ZINC PO) Take 1 tablet by mouth daily.  . Marland Kitchenxybutynin (DITROPAN-XL) 10 MG 24 hr tablet Take 10 mg by mouth daily.  . pantoprazole (PROTONIX) 40 MG tablet Take 40 mg by mouth 2 (two) times daily before a meal.   . simvastatin (ZOCOR) 40 MG tablet Take 40 mg by mouth daily.   . Na Sulfate-K Sulfate-Mg Sulf 17.5-3.13-1.6 GM/177ML SOLN Take 1 kit by mouth once for 1 dose. Apply Coupon=BIN: [id number]: CN GROUP: : [id number]: : [id number]. [DISCONTINUED] methylPREDNISolone (MEDROL DOSEPAK) 4 MG TBPK tablet Take 4-24 mg by mouth See admin instructions. see package; take 6 tablets and day 1, then, 5, 4, 3, 2, 1 on days 2 through 6. (Patient not taking: Reported on 03/12/2020)  . [DISCONTINUED] oxyCODONE (OXY IR/ROXICODONE) 5 MG immediate release tablet Take 1-2 tablets (5-10 mg total) by mouth every 4 (four) hours as needed for severe pain. (Patient not taking: Reported on 03/12/2020)  . [DISCONTINUED] oxyCODONE (OXYCONTIN) 15 mg 12 hr tablet Take 1 tablet (15 mg total) by mouth every 12 (twelve) hours. (Patient not taking: Reported on 03/12/2020)   No facility-administered encounter medications on file as of 07/04/2020.   Allergies  Allergen Reactions  . Metoprolol Other (See Comments)    Pt does not recall    Patient Active Problem List   Diagnosis Date Noted  . HNP (herniated nucleus pulposus), lumbar 01/13/2020   Social History   Socioeconomic History  . Marital status: Divorced    Spouse name: Not on file  . Number of children: 1  . Years of education: Not on file  . Highest  education level: Not on file  Occupational History  . Not on file  Tobacco Use  . Smoking status: Former Smoker    Types: Cigarettes    Quit date: 1970    Years since quitting: 51.9  . Smokeless tobacco: Never Used  Vaping Use  . Vaping Use: Never used  Substance and Sexual Activity  . Alcohol use: Yes    Comment: 3 per day  . Drug use: Yes    Types: Marijuana  . Sexual activity: Not on file  Other Topics Concern  . Not on file  Social History Narrative  . Not on file   Social Determinants of Health   Financial Resource Strain:   . Difficulty of Paying Living Expenses: Not on file  Food Insecurity:   . Worried About [employer]  Food in the Last Year: Not on file  . Ran Out of Food in the Last Year: Not on file  Transportation Needs:   . Lack of Transportation (Medical): Not on file  . Lack of Transportation (Non-Medical): Not on file  Physical Activity:   . Days of Exercise per Week: Not on file  . Minutes of Exercise per Session: Not on file  Stress:   . Feeling of Stress : Not on file  Social Connections:   . Frequency of Communication with Friends and Family: Not on file  . Frequency of Social Gatherings with Friends and Family: Not on file  . Attends Religious Services: Not on file  . Active Member of Clubs or Organizations: Not on file  . Attends Archivist Meetings: Not on file  . Marital Status: Not on file  Intimate Partner Violence:   . Fear of Current or Ex-Partner: Not on file  . Emotionally Abused: Not on file  . Physically Abused: Not on file  . Sexually Abused: Not on file    Mr. Clagett's family history includes Breast cancer in his mother; Heart disease in his father; Hypertension in his father and mother; Stomach cancer in his mother.      Objective:    Vitals:   07/04/20 0818  BP: 134/90  Pulse: 84    Physical Exam.Well-developed well-nourished Older WM in no acute distress.  Height, [id number], BMI 26.75  HEENT; nontraumatic  normocephalic, EOMI, PE RR LA, sclera anicteric. Oropharynx;not done Neck; supple, no JVD Cardiovascular; regular rate and rhythm with S1-S2, no murmur rub or gallop Pulmonary; scattered rhonchi bilaterally Abdomen; soft, nontender, nondistended, no palpable mass or hepatosplenomegaly, bowel sounds are active Rectal;not done Skin; benign exam, no jaundice rash or appreciable lesions Extremities; no clubbing cyanosis or edema skin warm and dry Neuro/Psych; alert and oriented x4, grossly nonfocal mood and affect appropriate       Assessment & Plan:   #63  69 year old white male with 38 pound weight loss over the past 7 months.  Most of this occurred after he had undergone lumbar laminectomy in May 6568 which was complicated by prolonged severe low back pain and what sounds like radiculopathy. He had an episode of significant abdominal pain in August which prompted an ER visit with CT scan as outlined above.  This did not reveal any pertinent findings to explain his acute episode of pain. Since that time he has not been having any significant ongoing pain but has continued to have lack of appetite. He is chronically on twice daily PPI  Rule out underlying chronic gastropathy, peptic ulcer disease, occult malignancy  #2 colon cancer screening-very remote colonoscopy per patient report  #3 prior history of peptic ulcer disease 4.  Status post Nissen fundoplication with loosened wrap as of 2010 -chronic GERD controlled with twice daily Protonix  Plan; continue Protonix 40 mg p.o. twice daily CBC with differential and c-Met Patient will be scheduled for upper endoscopy and colonoscopy with Dr. Ardis Hughs.  Both procedures were discussed in detail with patient including indications risks and benefits and he is agreeable to proceed. Patient has completed COVID-19 vaccination. Further recommendations pending findings as above.   #5 chronic depression  Shery Wauneka Genia Harold PA-C 07/04/2020   Cc:  Pieter Partridge, PA

## 2020-07-04 NOTE — Patient Instructions (Signed)
If you are age 71 or older, your body mass index should be between 23-30. Your Body mass index is 26.75 kg/m. If this is out of the aforementioned range listed, please consider follow up with your Primary Care Provider.  If you are age 76 or younger, your body mass index should be between 19-25. Your Body mass index is 26.75 kg/m. If this is out of the aformentioned range listed, please consider follow up with your Primary Care Provider.   You have been scheduled for an endoscopy and colonoscopy. Please follow the written instructions given to you at your visit today. Please pick up your prep supplies at the pharmacy within the next 1-3 days. If you use inhalers (even only as needed), please bring them with you on the day of your procedure.  Your provider has requested that you go to the basement level for lab work before leaving today. Press "B" on the elevator. The lab is located at the first door on the left as you exit the elevator.  Continue Pantoprazole 40 mg 1 tablet twice daily.  Follow up pending the results of your procedures.

## 2020-07-05 NOTE — Progress Notes (Signed)
I agree with the above note, plan 

## 2020-07-13 ENCOUNTER — Encounter: Payer: Medicare HMO | Admitting: Gastroenterology

## 2020-07-14 ENCOUNTER — Encounter: Payer: Self-pay | Admitting: Physician Assistant

## 2020-12-12 ENCOUNTER — Other Ambulatory Visit: Payer: Self-pay | Admitting: Physician Assistant

## 2020-12-12 DIAGNOSIS — Z87891 Personal history of nicotine dependence: Secondary | ICD-10-CM

## 2020-12-12 DIAGNOSIS — Z136 Encounter for screening for cardiovascular disorders: Secondary | ICD-10-CM

## 2021-05-13 HISTORY — PX: ESOPHAGOGASTRODUODENOSCOPY: SHX1529

## 2021-05-13 HISTORY — PX: COLONOSCOPY: SHX174

## 2022-06-06 IMAGING — CT CT ABD-PELV W/ CM
2 of 5 series · 15 of 46 positions shown, 17 images · IV contrast (omnipaque)
Comparison: None.

CLINICAL DATA: Abdominal pain.  Chills.

Abdominal abscess/infection suspected
EXAM:
CT ABDOMEN AND PELVIS WITH CONTRAST
TECHNIQUE: Multidetector CT imaging of the abdomen and pelvis was performed
using the standard protocol following bolus administration of
intravenous contrast.
CONTRAST:  100mL OMNIPAQUE IOHEXOL 300 MG/ML  SOLN

[Series 2: axial st · axial · 0.77mm/px · z∈[-427,-7]mm · 12 of 97 slices shown, 14 images]
[im 7/97  soft-tissue]
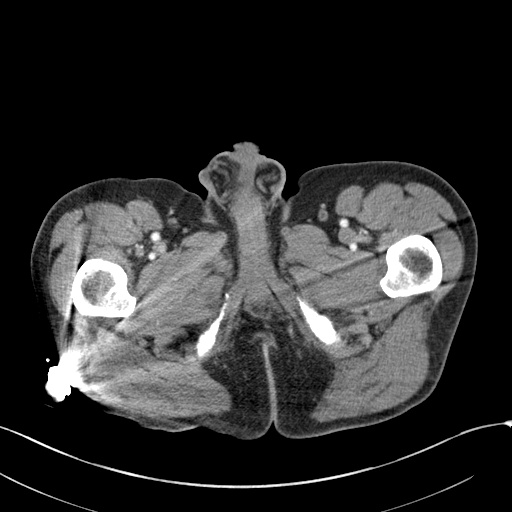
[im 7/97  bone]
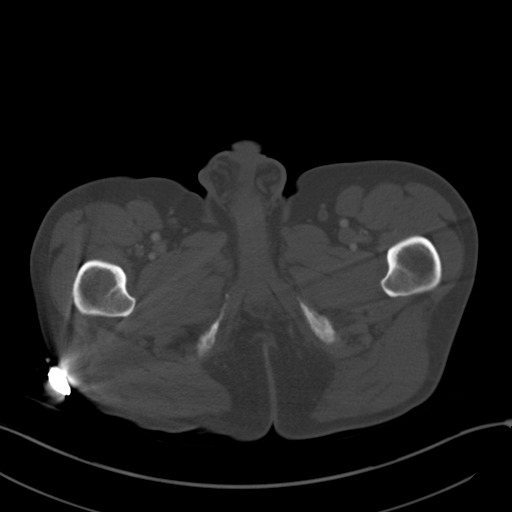
[im 13/97  soft-tissue]
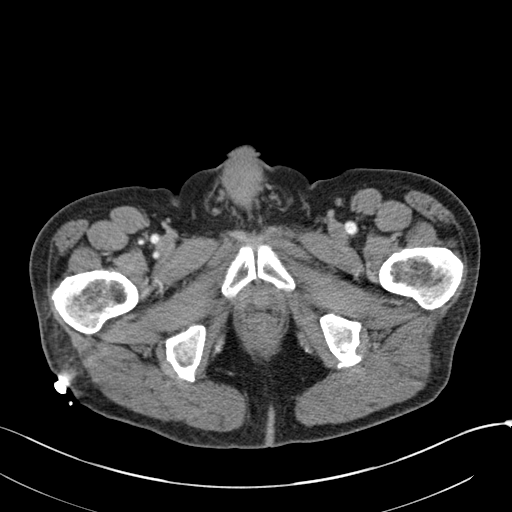
[im 25/97  soft-tissue]
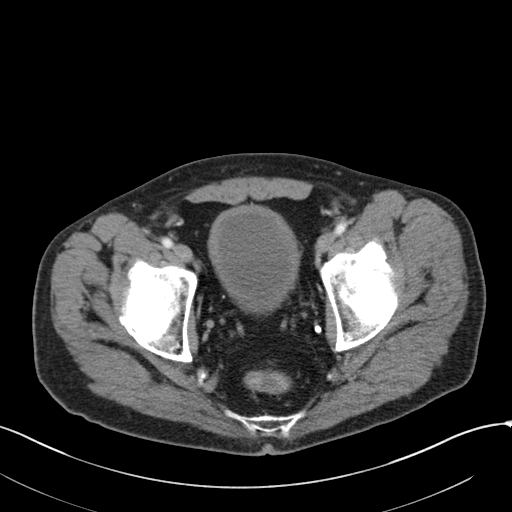
[im 31/97  soft-tissue]
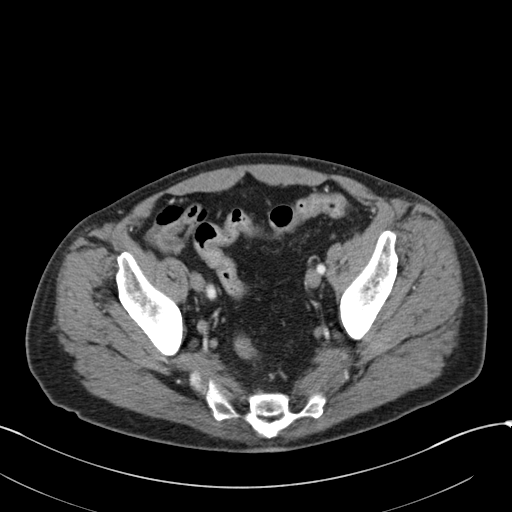
[im 37/97  soft-tissue]
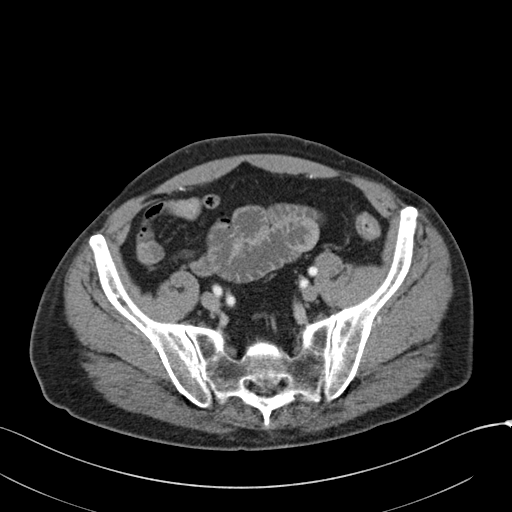
[im 43/97  soft-tissue]
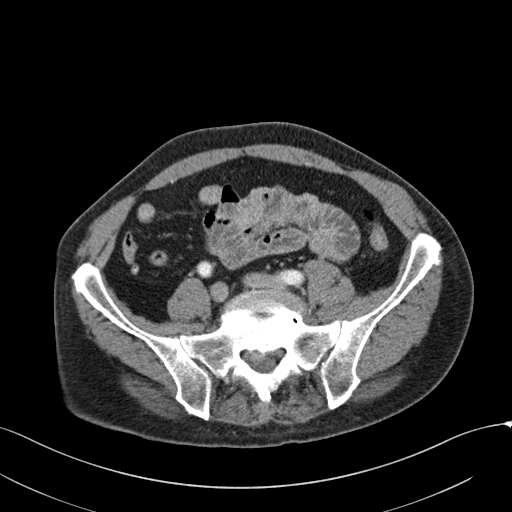
[im 55/97  soft-tissue]
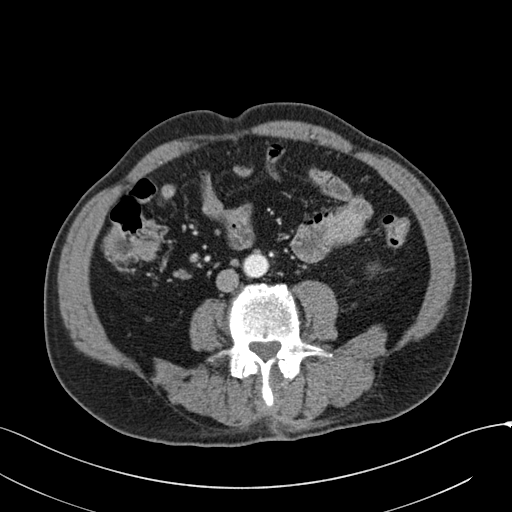
[im 61/97  soft-tissue]
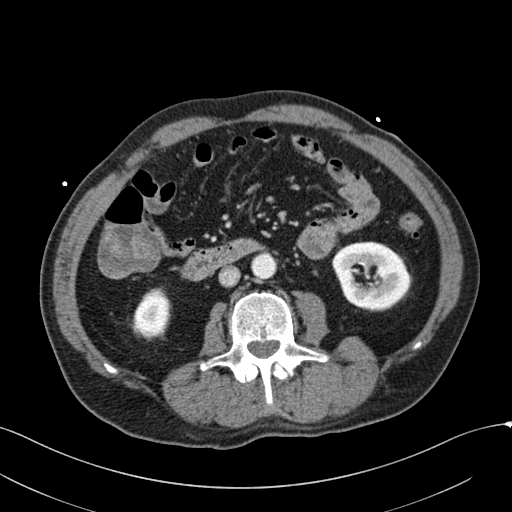
[im 67/97  soft-tissue]
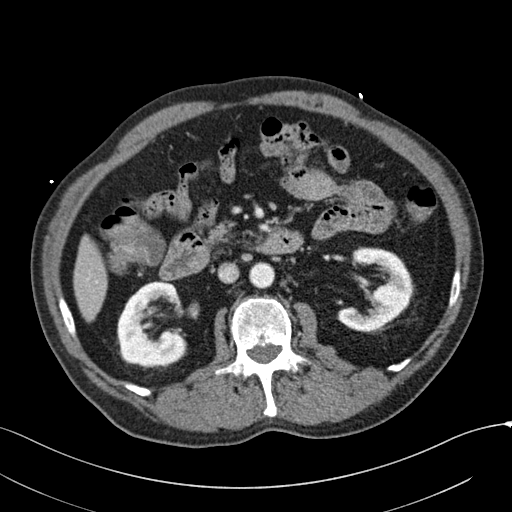
[im 67/97  bone]
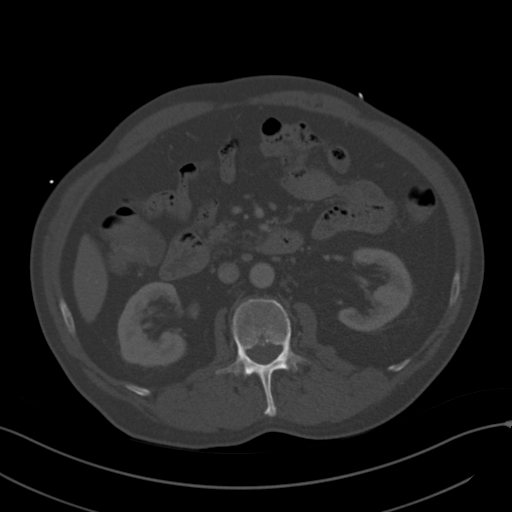
[im 73/97  soft-tissue]
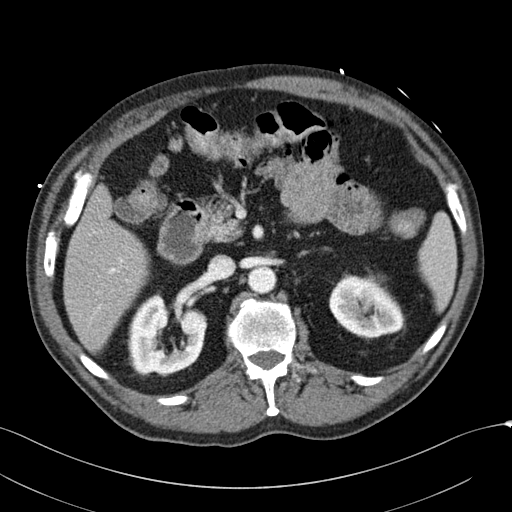
[im 85/97  soft-tissue]
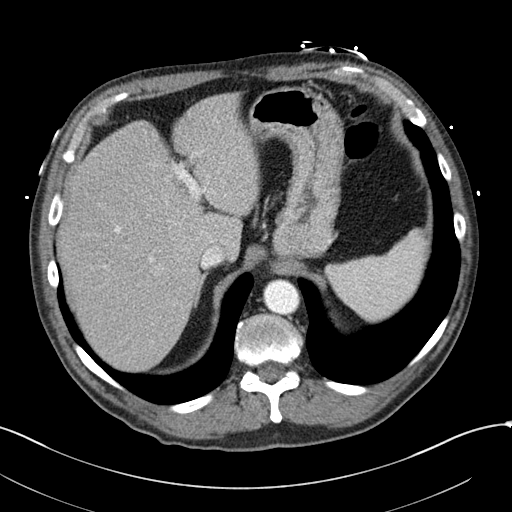
[im 91/97  soft-tissue]
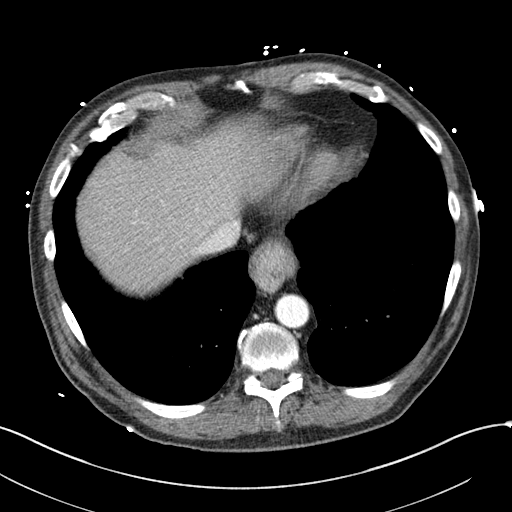

[Series 5: coronal st · coronal · 0.74mm/px · 3 of 157 slices shown]
[im 53/157  soft-tissue]
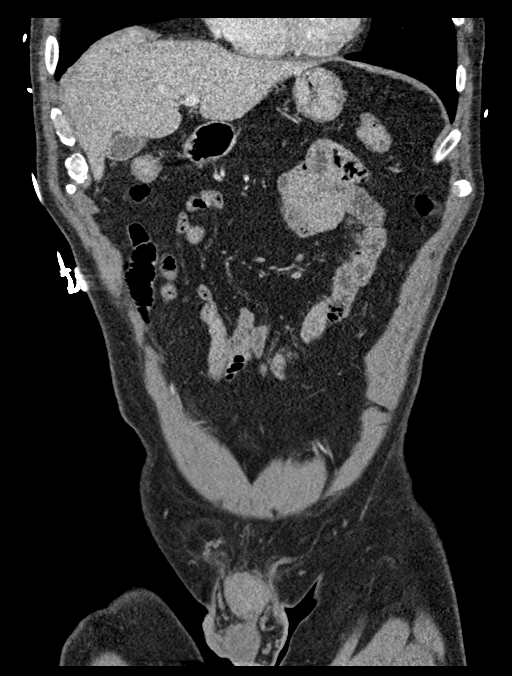
[im 70/157  soft-tissue]
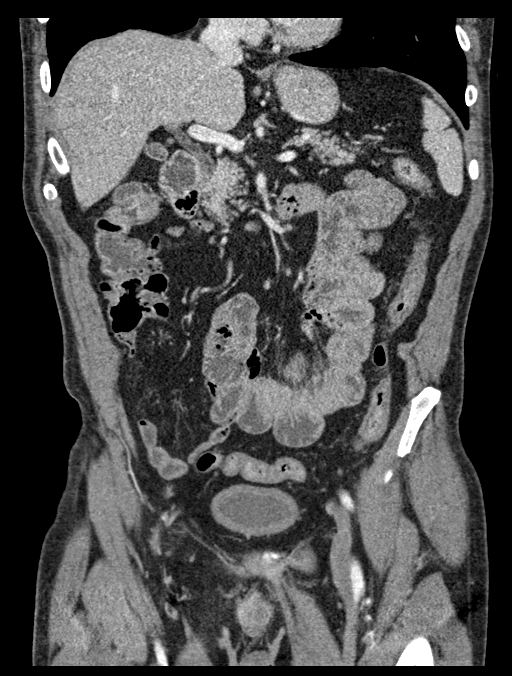
[im 87/157  soft-tissue]
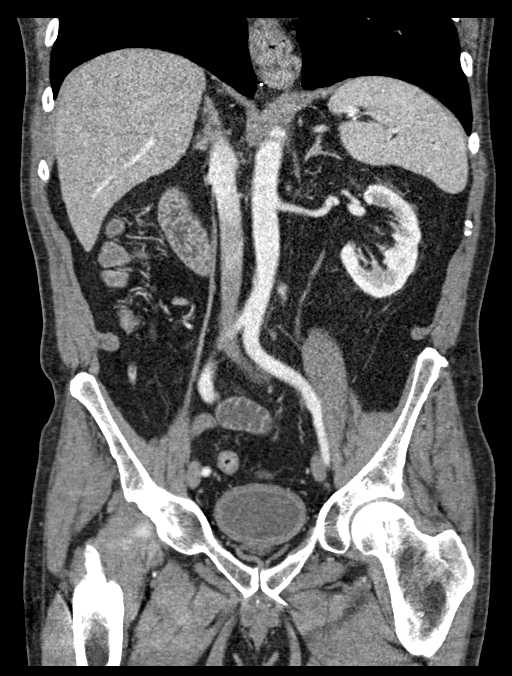

[15 of 46 positions shown; findings below may reference images not displayed]

FINDINGS: Lower chest: No acute findings. Heart is normal in size. Small to
moderate hiatal hernia with mild distal esophageal wall thickening.

Hepatobiliary: Mild diffusely decreased hepatic density consistent
with steatosis. No focal hepatic lesion. Gallbladder physiologically
distended, no calcified stone. No biliary dilatation.

Pancreas: No ductal dilatation or inflammation.

Spleen: Normal in size without focal abnormality.

Adrenals/Urinary Tract: No adrenal nodule. Multiple nonobstructing
stones in the left kidney. No hydronephrosis or perinephric edema.
Homogeneous renal enhancement with symmetric excretion on delayed
phase imaging. Urinary bladder is thick walled.

Stomach/Bowel: Surgical clips adjacent to the gastroesophageal
junction. Small to moderate hiatal hernia with mild wall thickening
of the distal esophagus. The stomach is otherwise unremarkable.
Normal positioning of the duodenum and ligament of Treitz. There is
no small bowel obstruction or inflammatory change. No small bowel
wall thickening. Normal appendix. Mild colonic diverticulosis
involving the descending and sigmoid colon without evidence of
diverticulitis. No colonic wall thickening or inflammation.

Vascular/Lymphatic: Minor aortic atherosclerosis. No aortic
aneurysm. Patent portal vein. Mesenteric vessels are patent. No
enlarged lymph nodes in the abdomen or pelvis.

Reproductive: Normal sized prostate gland with central prostatic
calcifications.

Other: No free air, free fluid, or intra-abdominal fluid collection.
Fat in both inguinal canals.

Musculoskeletal: Degenerative disc disease at L4-L5 and L5-S1. There
are no acute or suspicious osseous abnormalities.
IMPRESSION: 1. Bladder wall thickening, can be seen with cystitis. Recommend
correlation with urinalysis.
2. Technika Ressel with surgical clips at the gastroesophageal junction.
Small to moderate hiatal hernia with mild distal esophageal wall
thickening.
3. Mild hepatic steatosis.
4. Nonobstructing left nephrolithiasis.
5. Mild colonic diverticulosis without diverticulitis.

Aortic Atherosclerosis ([id number]-JNY.Y).

## 2024-03-03 ENCOUNTER — Encounter: Payer: Self-pay | Admitting: Gastroenterology

## 2024-03-12 ENCOUNTER — Ambulatory Visit: Admitting: Gastroenterology

## 2024-03-12 ENCOUNTER — Encounter: Payer: Self-pay | Admitting: Gastroenterology

## 2024-03-12 VITALS — BP 130/76 | HR 90 | Ht 72.0 in | Wt 193.4 lb

## 2024-03-12 DIAGNOSIS — K219 Gastro-esophageal reflux disease without esophagitis: Secondary | ICD-10-CM | POA: Diagnosis not present

## 2024-03-12 DIAGNOSIS — G8929 Other chronic pain: Secondary | ICD-10-CM

## 2024-03-12 DIAGNOSIS — K5900 Constipation, unspecified: Secondary | ICD-10-CM | POA: Diagnosis not present

## 2024-03-12 DIAGNOSIS — R109 Unspecified abdominal pain: Secondary | ICD-10-CM | POA: Diagnosis not present

## 2024-03-12 MED ORDER — FAMOTIDINE 20 MG PO TABS: 20.0000 mg | ORAL_TABLET | Freq: Every day | ORAL | 3 refills | Status: AC

## 2024-03-12 NOTE — Patient Instructions (Signed)
 _______________________________________________________  If your blood pressure at your visit was 140/90 or greater, please contact your primary care physician to follow up on this.  _______________________________________________________  If you are age 75 or older, your body mass index should be between 23-30. Your Body mass index is 26.23 kg/m. If this is out of the aforementioned range listed, please consider follow up with your Primary Care Provider.  If you are age 63 or younger, your body mass index should be between 19-25. Your Body mass index is 26.23 kg/m. If this is out of the aformentioned range listed, please consider follow up with your Primary Care Provider.   ________________________________________________________  The [facility] providers would like to encourage you to use MYCHART to communicate with providers for non-urgent requests or questions.  Due to long hold times on the telephone, sending your provider a message by [facility] may be a faster and more efficient way to get a response.  Please allow 48 business hours for a response.  Please remember that this is for non-urgent requests.  _______________________________________________________  [facility] is using a team-based approach to care.  Your team is made up of your doctor and two to three APPS. Our APPS (Nurse Practitioners and Physician Assistants) work with your physician to ensure care continuity for you. They are fully qualified to address your health concerns and develop a treatment plan. They communicate directly with your gastroenterologist to care for you. Seeing the Advanced Practice Practitioners on your physician's team can help you by facilitating care more promptly, often allowing for earlier appointments, access to diagnostic testing, procedures, and other specialty referrals.   We have sent the following medications to your pharmacy for you to pick up at your convenience:  START: Pepcid   20mg  one tablet at bedtime.  You have been scheduled for an endoscopy. Please follow written instructions given to you at your visit today.  If you use inhalers (even only as needed), please bring them with you on the day of your procedure.  If you take any of the following medications, they will need to be adjusted prior to your procedure:   DO NOT TAKE 7 DAYS PRIOR TO TEST- Trulicity (dulaglutide) Ozempic, Wegovy (semaglutide) Mounjaro (tirzepatide) Bydureon Bcise (exanatide extended release)  DO NOT TAKE 1 DAY PRIOR TO YOUR TEST Rybelsus (semaglutide) Adlyxin (lixisenatide) Victoza (liraglutide) Byetta (exanatide) ___________________________________________________________________________  You have been scheduled for an Upper GI Series at [facility] radiology. Your appointment is on 03/30/24 at 9:00am. Please arrive 30 minutes prior to your test for registration. Make sure not to eat or drink anything after midnight on the night before your test. If you need to reschedule, please call radiology at [phone]. ________________________________________________________________ An upper GI series uses x rays to help diagnose problems of the upper GI tract, which includes the esophagus, stomach, and duodenum. The duodenum is the first part of the small intestine. An upper GI series is conducted by a radiology technologist or a radiologist--a doctor who specializes in x-ray imaging--at a hospital or outpatient center. While sitting or standing in front of an x-ray machine, the patient drinks barium liquid, which is often white and has a chalky consistency and taste. The barium liquid coats the lining of the upper GI tract and makes signs of disease show up more clearly on x rays. X-ray video, called fluoroscopy, is used to view the barium liquid moving through the esophagus, stomach, and duodenum. Additional x rays and fluoroscopy are performed while the patient lies on an  x-ray  table. To fully coat the upper GI tract with barium liquid, the technologist or radiologist may press on the abdomen or ask the patient to change position. Patients hold still in various positions, allowing the technologist or radiologist to take x rays of the upper GI tract at different angles. If a technologist conducts the upper GI series, a radiologist will later examine the images to look for problems.  This test typically takes about 1 hour to complete. __________________________________________________________________   Due to recent changes in healthcare laws, you may see the results of your imaging and laboratory studies on MyChart before your provider has had a chance to review them.  We understand that in some cases there may be results that are confusing or concerning to you. Not all laboratory results come back in the same time frame and the provider may be waiting for multiple results in order to interpret others.  Please give us  48 hours in order for your provider to thoroughly review all the results before contacting the office for clarification of your results.

## 2024-03-12 NOTE — Progress Notes (Signed)
 Chief Complaint: For GI evaluation  Referring Provider:  Dasie Perkins, PA      ASSESSMENT AND PLAN;   #1. GERD s/p open Nissen's 1986, now with slipped Nissen 2017 #2. Mild constipation #3. Chronic abd pain since 1986. Neg CT AP 09/2023 for any acute GI problems  Plan: -UGI series (Give Ba tab) -EGD -Nexium 40mg  po BID -Pepcid  20mg  po at bedtime. -If needed, 24hr pH/mano. -Stop all alcohol use, sodas etc. -Avoid nonsteroidals. -Minimize marijuana use. -Nonpharmacologic means of reflux control.   HPI:    Martin Suarez is a 73 y.o. male  With history of chronic abdominal pain, GERD, anxiety/depression with history of suicidal ideation in the past, alcoholic hepatitis, HTN, HLD, prediabetes, BPH, nephrolithiasis, hypogonadism, history of HSV, chronic low back pain, status post left hemilaminectomy 2021, syncope, mild OSA not on CPAP, insomnia, bilateral cataract, History of Present Illness Martin Suarez is a 73 year old male with reflux and hiatal hernia who presents with worsening reflux symptoms.  He has a long-standing history of reflux and hiatal hernia, with a previous surgical intervention in 1986 that was unsuccessful. His reflux symptoms have worsened over time, and approximately a week and a half ago, he experienced an episode of vomiting undigested food, which was a new occurrence. He has been on various medications for reflux, including Nexium, omeprazole, and pantoprazole , but reports no significant improvement. Currently, he is taking Nexium and has previously tried omeprazole and pantoprazole , but reports no significant improvement.  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 He describes constant discomfort in his lower abdomen, particularly when wearing tight clothing, necessitating loosening his pants to alleviate the pain.  He recalls a recent blood test indicating a potential heart issue,  but he was not provided with further details due to a change in his healthcare provider. No chest pain and he has not seen a cardiologist.  He smokes marijuana and consumes a small amount of alcohol daily, specifically an 8% alcohol soft drink, and drinks unsweetened iced tea regularly. His digestive system seems to be functioning well, with protein drinks causing rapid bowel movements. No urinary problems but notes urgency with bowel movements. He has a history of back surgery that was not successful, but he does not elaborate on current back issues.  Also has chronic abdominal pain ever since Nissen's fundoplication in 1986.  No associated weight loss.  Pain gets better with moving his underwear or lying down in addition to moving his belt.  Had negative extensive evaluation including colonoscopy and CT Abdo/pelvis as below.  He was scheduled to have EGD at [facility].  Found out that Heather is no longer taking his insurance through [employer].     Continues to smoke pot ETOH 12 oz 8% every day      Results  Past GI workup EGD October 2022: Slipped Nissen's fundoplication.  3 cm hiatal hernia.  Gastric biopsies negative for HP.  Colonoscopy October 2022: for IDA.  Negative except for mild colonic diverticulosis.  Few erosions in ascending colon likely due to nonsteroidal use.  Recommend to repeat in 10 years. Bx-no pathologic abnormality.  CT Abdo/pelvis with contrast 09/2023 -Negative for any acute abnormalities. -Nonobstructing bilateral renal calculi.  No hydronephrosis.  Mild nonspecific left perinephric stranding. -Diffuse urinary bladder wall thickening. -Prostatic glands contains dystrophic calcifications -DJD L4-L5, L5-S1 -Normal liver.  However measures 16.5 cm.  Normal gallbladder.  Spleen measures 11 cm.  No evidence  of liver cirrhosis. -Aortic atherosclerosis.  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  Also had EGD/colonoscopy 09/2008 at [facility] in  [place], [place].  EGD showed LA grade B reflux esophagitis, slipped Nissen's fundoplication with associated sliding hiatal hernia.  Normal duodenum.  Colonoscopy: Prep was good.  Entire colon appeared normal.  Repeat in 10 years. Past Medical History:  Diagnosis Date   Abnormal finding on imaging    Alcoholic hepatitis    Anxiety    Chronic abdominal pain    Constipation    Depression    Diverticulosis    Herpes simplex    Hiatal hernia with GERD    High cholesterol    Insomnia    Low testosterone    Major depressive disorder, recurrent, mild (HCC)    Prostate enlargement    Suicidal ideation    Syncope     Past Surgical History:  Procedure Laterality Date   COLONOSCOPY  05/25/2021   [facility]  Mild diverticulosis sigmoid Erosion asc colon bx   ESOPHAGOGASTRODUODENOSCOPY  05/25/2021   [facility] 3 cm non erosive gastritis in hernia pouch bx   LUMBAR DISC SURGERY     NISSEN FUNDOPLICATION     TONSILLECTOMY      Family History  Problem Relation Age of Onset   Breast cancer Mother    Stomach cancer Mother    Hypertension Mother    Stroke Mother    Heart disease Father    Hypertension Father     Social History   Tobacco Use   Smoking status: Former    Current packs/day: 0.00    Types: Cigarettes    Quit date: 1970    Years since quitting: 55.6   Smokeless tobacco: Never  Vaping Use   Vaping status: Never Used  Substance Use Topics   Alcohol use: Yes    Comment: 3 per day   Drug use: Yes    Types: Marijuana    Current Outpatient Medications  Medication Sig Dispense Refill   Ascorbic Acid (VITA-C PO) Take 1 tablet by mouth daily.     b complex vitamins tablet Take 1 tablet by mouth daily.     Cholecalciferol  (VITAMIN D PO) Take 1 capsule by mouth daily.     diphenhydrAMINE (BENADRYL) 25 MG tablet Take 25-75 mg by mouth every 6 (six) hours as needed for itching or allergies.     finasteride  (PROSCAR ) 5 MG tablet Take 5 mg by mouth daily.      FLUoxetine (PROZAC) 20 MG capsule Take 20-40 mg by mouth See admin instructions. 40 mg in the am and 20 mg in the pm     gabapentin (NEURONTIN) 300 MG capsule Take 600-900 mg by mouth See admin instructions. 600mg  in he morning and 900mg  at bedtime     Glucosamine-Chondroitin (OSTEO BI-FLEX REGULAR STRENGTH) 250-200 MG TABS Take 1 tablet by mouth in the morning and at bedtime.     hydroxypropyl methylcellulose / hypromellose (ISOPTO TEARS / GONIOVISC) 2.5 % ophthalmic solution Place 1 drop into both eyes 3 (three) times daily as needed for dry eyes.     ibuprofen (ADVIL) 800 MG tablet Take 800 mg by mouth every 8 (eight) hours as needed for fever, headache, mild pain, moderate pain or cramping.     MAGNESIUM  PO Take 1 tablet by mouth daily.     Melatonin 10 MG TABS Take 30 mg by mouth at bedtime.     methocarbamol (ROBAXIN)  750 MG tablet Take 750 mg by mouth 4 (four) times daily as needed for muscle spasms.     mirtazapine  (REMERON ) 45 MG tablet Take 45 mg by mouth at bedtime.     Multiple Vitamins-Minerals (ZINC PO) Take 1 tablet by mouth daily.     oxybutynin (DITROPAN-XL) 10 MG 24 hr tablet Take 10 mg by mouth daily.     pantoprazole  (PROTONIX ) 40 MG tablet Take 40 mg by mouth 2 (two) times daily before a meal.      simvastatin  (ZOCOR ) 40 MG tablet Take 40 mg by mouth daily.      No current facility-administered medications for this visit.    Allergies  Allergen Reactions   Metoprolol Other (See Comments)    Pt does not recall     Review of Systems:  Psychiatric/Behavioral: has anxiety or depression     Physical Exam:    BP 130/76 (BP Location: Right Arm, Patient Position: Sitting, Cuff Size: Normal)   Pulse 90   Ht 6' (1.829 m)   Wt 193 lb 6.4 oz (87.7 kg)   SpO2 96%   BMI 26.23 kg/m  Wt Readings from Last 3 Encounters:  03/12/24 193 lb 6.4 oz (87.7 kg)  07/04/20 189 lb 2 oz (85.8 kg)  03/12/20 200 lb (90.7 kg)   Constitutional:  Well-developed, in no acute  distress. Psychiatric: Normal mood and affect. Behavior is normal. HEENT: Pupils normal.  Conjunctivae are normal. No scleral icterus. Cardiovascular: Normal rate, regular rhythm. No edema Pulmonary/chest: Effort normal and breath sounds normal. No wheezing, rales or rhonchi. Abdominal: Soft, nondistended. Nontender. Bowel sounds active throughout. There are no masses palpable. No hepatomegaly.  Well-healed surgical scar. Rectal: Deferred Neurological: Alert and oriented to person place and time. Skin: Skin is warm and dry. No rashes noted.  Data Reviewed: I have personally reviewed following labs and imaging studies  CBC:    Latest Ref Rng & Units 07/04/2020    9:39 AM 03/12/2020    9:10 PM 01/15/2020    2:27 AM  CBC  WBC 4.0 - 10.5 K/uL 9.9  13.4  9.3   Hemoglobin 13.0 - 17.0 g/dL 85.3  84.5  88.8   Hematocrit 39.0 - 52.0 % 42.4  45.3  32.6   Platelets 150.0 - 400.0 K/uL 309.0  334  378     CMP:    Latest Ref Rng & Units 07/04/2020    9:39 AM 03/13/2020    3:09 AM 03/12/2020    9:10 PM  CMP  Glucose 70 - 99 mg/dL 859  894  857   BUN 6 - 23 mg/dL 19  23  26    Creatinine 0.40 - 1.50 mg/dL 8.94  9.06  8.74   Sodium 135 - 145 mEq/L 138  136  137   Potassium 3.5 - 5.1 mEq/L 4.2  3.2  3.5   Chloride 96 - 112 mEq/L 102  109  100   CO2 19 - 32 mEq/L 26  21  18    Calcium 8.4 - 10.5 mg/dL 9.7  7.7  89.4   Total Protein 6.0 - 8.3 g/dL 7.3   8.1   Total Bilirubin 0.2 - 1.2 mg/dL 0.6   0.8   Alkaline Phos 39 - 117 U/L 92   91   AST 0 - 37 U/L 31   36   ALT 0 - 53 U/L 31   35    Additional labs: 09/25/2023: Albumin 4.8, TB 0.6, ALT 26, alk phos 101,  AST 30, hemoglobin A1c 6.1, TSH 2.17, hemoglobin 14.2, platelets 272    Anselm Bring, MD 03/12/2024, 11:17 AM  Cc: Dasie Perkins, PA

## 2024-03-30 ENCOUNTER — Inpatient Hospital Stay (HOSPITAL_COMMUNITY): Admission: RE | Admit: 2024-03-30 | Source: Ambulatory Visit

## 2024-04-27 ENCOUNTER — Ambulatory Visit: Admitting: Gastroenterology

## 2024-05-26 ENCOUNTER — Ambulatory Visit: Admitting: Gastroenterology

## 2024-05-26 ENCOUNTER — Encounter: Payer: Self-pay | Admitting: Gastroenterology

## 2024-05-26 VITALS — BP 117/72 | HR 56 | Temp 97.4°F | Resp 20 | Ht 72.0 in | Wt 190.0 lb

## 2024-05-26 DIAGNOSIS — T85521A Displacement of esophageal anti-reflux device, initial encounter: Secondary | ICD-10-CM

## 2024-05-26 DIAGNOSIS — K2100 Gastro-esophageal reflux disease with esophagitis, without bleeding: Secondary | ICD-10-CM

## 2024-05-26 DIAGNOSIS — K2970 Gastritis, unspecified, without bleeding: Secondary | ICD-10-CM

## 2024-05-26 DIAGNOSIS — K449 Diaphragmatic hernia without obstruction or gangrene: Secondary | ICD-10-CM | POA: Diagnosis not present

## 2024-05-26 DIAGNOSIS — K2289 Other specified disease of esophagus: Secondary | ICD-10-CM | POA: Diagnosis not present

## 2024-05-26 DIAGNOSIS — K31A11 Gastric intestinal metaplasia without dysplasia, involving the antrum: Secondary | ICD-10-CM

## 2024-05-26 DIAGNOSIS — G8929 Other chronic pain: Secondary | ICD-10-CM

## 2024-05-26 DIAGNOSIS — K219 Gastro-esophageal reflux disease without esophagitis: Secondary | ICD-10-CM

## 2024-05-26 DIAGNOSIS — K3189 Other diseases of stomach and duodenum: Secondary | ICD-10-CM

## 2024-05-26 MED ORDER — SODIUM CHLORIDE 0.9 % IV SOLN: 500.0000 mL | INTRAVENOUS | Status: DC

## 2024-05-26 NOTE — Progress Notes (Signed)
 Chief Complaint: For GI evaluation  Referring Provider:  Charlanne Groom, MD      ASSESSMENT AND PLAN;   #1. GERD s/p open Nissen's 1986, now with slipped Nissen 2017 #2. Mild constipation #3. Chronic abd pain since 1986. Neg CT AP 09/2023 for any acute GI problems  Plan: -UGI series (Give Ba tab) -EGD -Nexium 40mg  po BID -Pepcid  20mg  po at bedtime. -If needed, 24hr pH/mano. -Stop all alcohol use, sodas etc. -Avoid nonsteroidals. -Minimize marijuana use. -Nonpharmacologic means of reflux control.   Pt was scheduled for upper GI- didn't show up For EGD today  HPI:    Martin Suarez is a 73 y.o. male  With history of chronic abdominal pain, GERD, anxiety/depression with history of suicidal ideation in the past, alcoholic hepatitis, HTN, HLD, prediabetes, BPH, nephrolithiasis, hypogonadism, history of HSV, chronic low back pain, status post left hemilaminectomy 2021, syncope, mild OSA not on CPAP, insomnia, bilateral cataract, History of Present Illness Martin Suarez is a 72 year old male with reflux and hiatal hernia who presents with worsening reflux symptoms.  He has a long-standing history of reflux and hiatal hernia, with a previous surgical intervention in 1986 that was unsuccessful. His reflux symptoms have worsened over time, and approximately a week and a half ago, he experienced an episode of vomiting undigested food, which was a new occurrence. He has been on various medications for reflux, including Nexium, omeprazole, and pantoprazole , but reports no significant improvement. Currently, he is taking Nexium and has previously tried omeprazole and pantoprazole , but reports no significant improvement.  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 He describes constant discomfort in his lower abdomen, particularly when wearing tight clothing, necessitating loosening his pants to alleviate the pain.  He  recalls a recent blood test indicating a potential heart issue, but he was not provided with further details due to a change in his healthcare provider. No chest pain and he has not seen a cardiologist.  He smokes marijuana and consumes a small amount of alcohol daily, specifically an 8% alcohol soft drink, and drinks unsweetened iced tea regularly. His digestive system seems to be functioning well, with protein drinks causing rapid bowel movements. No urinary problems but notes urgency with bowel movements. He has a history of back surgery that was not successful, but he does not elaborate on current back issues.  Also has chronic abdominal pain ever since Nissen's fundoplication in 1986.  No associated weight loss.  Pain gets better with moving his underwear or lying down in addition to moving his belt.  Had negative extensive evaluation including colonoscopy and CT Abdo/pelvis as below.  He was scheduled to have EGD at [facility].  Found out that Martin Suarez is no longer taking his insurance through [place].     Continues to smoke pot ETOH 12 oz 8% every day      Results  Past GI workup EGD October 2022: Slipped Nissen's fundoplication.  3 cm hiatal hernia.  Gastric biopsies negative for HP.  Colonoscopy October 2022: for IDA.  Negative except for mild colonic diverticulosis.  Few erosions in ascending colon likely due to nonsteroidal use.  Recommend to repeat in 10 years. Bx-no pathologic abnormality.  CT Abdo/pelvis with contrast 09/2023 -Negative for any acute abnormalities. -Nonobstructing bilateral renal calculi.  No hydronephrosis.  Mild nonspecific left perinephric stranding. -Diffuse urinary bladder wall thickening. -Prostatic glands contains dystrophic calcifications -DJD L4-L5, L5-S1 -Normal liver.  However measures  16.5 cm.  Normal gallbladder.  Spleen measures 11 cm.  No evidence of liver cirrhosis. -Aortic atherosclerosis.  Incidental note is made of her right hepatic artery origin  from SMA.  Also had EGD/colonoscopy 09/2008 at [facility] in [place], [place].  EGD showed LA grade B reflux esophagitis, slipped Nissen's fundoplication with associated sliding hiatal hernia.  Normal duodenum.  Colonoscopy: Prep was good.  Entire colon appeared normal.  Repeat in 10 years. Past Medical History:  Diagnosis Date   Abnormal finding on imaging    Alcoholic hepatitis (HCC)    Anxiety    Chronic abdominal pain    Constipation    Depression    Diverticulosis    Herpes simplex    Hiatal hernia with GERD    High cholesterol    Insomnia    Low testosterone    Major depressive disorder, recurrent, mild    Prostate enlargement    Suicidal ideation    Syncope     Past Surgical History:  Procedure Laterality Date   COLONOSCOPY  [facility]/25/2021   [facility]  Mild diverticulosis sigmoid Erosion asc colon bx   ESOPHAGOGASTRODUODENOSCOPY  05/25/2021   [facility] 3 cm non erosive gastritis in hernia pouch bx   LUMBAR DISC SURGERY     NISSEN FUNDOPLICATION     TONSILLECTOMY      Family History  Problem Relation Age of Onset   Breast cancer Mother    Stomach cancer Mother    Hypertension Mother    Stroke Mother    Heart disease Father    Hypertension Father    Colon cancer Neg Hx    Esophageal cancer Neg Hx    Rectal cancer Neg Hx     Social History   Tobacco Use   Smoking status: Former    Current packs/day: 0.00    Types: Cigarettes    Quit date: 1970    Years since quitting: 55.8   Smokeless tobacco: Never  Vaping Use   Vaping status: Never Used  Substance Use Topics   Alcohol use: Yes    Comment: 3 per day   Drug use: Yes    Types: Marijuana    Current Outpatient Medications  Medication Sig Dispense Refill   Ascorbic Acid (VITA-C PO) Take 1 tablet by mouth daily.     b complex vitamins tablet Take 1 tablet by mouth daily.     busPIRone (BUSPAR) 15 MG tablet Take 30 mg by mouth.     Cholecalciferol  (VITAMIN D PO) Take 1 capsule by mouth  daily.     cyclobenzaprine (FLEXERIL) 10 MG tablet Take 10 mg by mouth.     diphenhydrAMINE (BENADRYL) 25 MG tablet Take 25-75 mg by mouth every 6 (six) hours as needed for itching or allergies.     folic acid  (FOLVITE ) 1 MG tablet Take 1 mg by mouth.     gabapentin (NEURONTIN) 300 MG capsule Take 600-900 mg by mouth See admin instructions. 600mg  in he morning and 900mg  at bedtime     Glucosamine-Chondroitin (OSTEO BI-FLEX REGULAR STRENGTH) 250-200 MG TABS Take 1 tablet by mouth in the morning and at bedtime.     Melatonin 10 MG TABS Take 30 mg by mouth at bedtime.     metoCLOPramide (REGLAN) 5 MG tablet Take 5 mg by mouth 4 (four) times daily.     Multiple Vitamins-Minerals (ZINC PO) Take 1 tablet by mouth daily.     omeprazole (PRILOSEC) 20 MG capsule Take 20 mg  by mouth.     Polyvinyl Alcohol-Povidone PF 1.4-0.6 % SOLN Apply to eye.     simvastatin  (ZOCOR ) 40 MG tablet Take 40 mg by mouth daily.      famotidine  (PEPCID ) 20 MG tablet Take 1 tablet (20 mg total) by mouth at bedtime. 30 tablet 3   finasteride  (PROSCAR ) 5 MG tablet Take 5 mg by mouth daily.     FLUoxetine (PROZAC) 20 MG capsule Take 20-40 mg by mouth See admin instructions. 40 mg in the am and 20 mg in the pm     hydroxypropyl methylcellulose / hypromellose (ISOPTO TEARS / GONIOVISC) 2.5 % ophthalmic solution Place 1 drop into both eyes 3 (three) times daily as needed for dry eyes. (Patient not taking: Reported on 05/26/2024)     ibuprofen (ADVIL) 800 MG tablet Take 800 mg by mouth every 8 (eight) hours as needed for fever, headache, mild pain, moderate pain or cramping.     MAGNESIUM  PO Take 1 tablet by mouth daily.     methocarbamol (ROBAXIN) 750 MG tablet Take 750 mg by mouth 4 (four) times daily as needed for muscle spasms.     mirtazapine  (REMERON ) 45 MG tablet Take 45 mg by mouth at bedtime.     oxybutynin (DITROPAN-XL) 10 MG 24 hr tablet Take 10 mg by mouth daily. (Patient not taking: Reported on 05/26/2024)      pantoprazole  (PROTONIX ) 40 MG tablet Take 40 mg by mouth 2 (two) times daily before a meal.      Current Facility-Administered Medications  Medication Dose Route Frequency Provider Last Rate Last Admin   0.9 %  sodium chloride  infusion  500 mL Intravenous Continuous Charlanne Groom, MD        Allergies  Allergen Reactions   Metoprolol Other (See Comments)    Had trouble breathing   Propranolol Hcl Other (See Comments)    Sweating, feeling faint    Review of Systems:  Psychiatric/Behavioral: has anxiety or depression     Physical Exam:    BP (!) 146/76   Pulse 86   Temp (!) 97.4 F (36.3 C) (Temporal)   Ht 6' (1.829 m)   Wt 190 lb (86.2 kg)   SpO2 98%   BMI 25.77 kg/m  Wt Readings from Last 3 Encounters:  05/26/24 190 lb (86.2 kg)  03/12/24 193 lb 6.4 oz (87.7 kg)  07/04/20 189 lb 2 oz (85.8 kg)   Constitutional:  Well-developed, in no acute distress. Psychiatric: Normal mood and affect. Behavior is normal. HEENT: Pupils normal.  Conjunctivae are normal. No scleral icterus. Cardiovascular: Normal rate, regular rhythm. No edema Pulmonary/chest: Effort normal and breath sounds normal. No wheezing, rales or rhonchi. Abdominal: Soft, nondistended. Nontender. Bowel sounds active throughout. There are no masses palpable. No hepatomegaly.  Well-healed surgical scar. Rectal: Deferred Neurological: Alert and oriented to person place and time. Skin: Skin is warm and dry. No rashes noted.  Data Reviewed: I have personally reviewed following labs and imaging studies  CBC:    Latest Ref Rng & Units 07/04/2020    9:39 AM 03/12/2020    9:10 PM 01/15/2020    2:27 AM  CBC  WBC 4.0 - 10.5 K/uL 9.9  13.4  9.3   Hemoglobin 13.0 - 17.0 g/dL 85.3  84.5  88.8   Hematocrit 39.0 - 52.0 % 42.4  45.3  32.6   Platelets 150.0 - 400.0 K/uL 309.0  334  378     CMP:    Latest Ref Rng &  Units 07/04/2020    9:39 AM 03/13/2020    3:09 AM 03/12/2020    9:10 PM  CMP  Glucose 70 - 99 mg/dL 859   894  857   BUN 6 - 23 mg/dL 19  23  26    Creatinine 0.40 - 1.50 mg/dL 8.94  9.06  8.74   Sodium 135 - 145 mEq/L 138  136  137   Potassium 3.5 - 5.1 mEq/L 4.2  3.2  3.5   Chloride 96 - 112 mEq/L 102  109  100   CO2 19 - 32 mEq/L 26  21  18    Calcium 8.4 - 10.5 mg/dL 9.7  7.7  89.4   Total Protein 6.0 - 8.3 g/dL 7.3   8.1   Total Bilirubin 0.2 - 1.2 mg/dL 0.6   0.8   Alkaline Phos 39 - 117 U/L 92   91   AST 0 - 37 U/L 31   36   ALT 0 - 53 U/L 31   35    Additional labs: 09/25/2023: Albumin 4.8, TB 0.6, ALT 26, alk phos 101, AST 30, hemoglobin A1c 6.1, TSH 2.17, hemoglobin 14.2, platelets 272    Anselm Bring, MD 05/26/2024, 1:48 PM  Cc: Bring Groom, MD

## 2024-05-26 NOTE — Progress Notes (Signed)
 Called to room to assist during endoscopic procedure.  Patient ID and intended procedure confirmed with present staff. Received instructions for my participation in the procedure from the performing physician.

## 2024-05-26 NOTE — Patient Instructions (Addendum)
 Please see Recommendations on procedure report.  Handouts Provided:  GERD  YOU HAD AN ENDOSCOPIC PROCEDURE TODAY AT THE [facility]:   Refer to the procedure report that was given to you for any specific questions about what was found during the examination.  If the procedure report does not answer your questions, please call your gastroenterologist to clarify.  If you requested that your care partner not be given the details of your procedure findings, then the procedure report has been included in a sealed envelope for you to review at your convenience later.  YOU SHOULD EXPECT: Some feelings of bloating in the abdomen. Passage of more gas than usual.  Walking can help get rid of the air that was put into your GI tract during the procedure and reduce the bloating. If you had a lower endoscopy (such as a colonoscopy or flexible sigmoidoscopy) you may notice spotting of blood in your stool or on the toilet paper. If you underwent a bowel prep for your procedure, you may not have a normal bowel movement for a few days.  Please Note:  You might notice some irritation and congestion in your nose or some drainage.  This is from the oxygen used during your procedure.  There is no need for concern and it should clear up in a day or so.  SYMPTOMS TO REPORT IMMEDIATELY:  Following upper endoscopy (EGD)  Vomiting of blood or coffee ground material  New chest pain or pain under the shoulder blades  Painful or persistently difficult swallowing  New shortness of breath  Fever of 100F or higher  Black, tarry-looking stools  For urgent or emergent issues, a gastroenterologist can be reached at any hour by calling [phone]. Do not use MyChart messaging for urgent concerns.    DIET:  We do recommend a small meal at first, but then you may proceed to your regular diet.  Drink plenty of fluids but you should avoid alcoholic beverages for 24 hours.  ACTIVITY:  You should plan to take it  easy for the rest of today and you should NOT DRIVE or use heavy machinery until tomorrow (because of the sedation medicines used during the test).    FOLLOW UP: Our staff will call the number listed on your records the next business day following your procedure.  We will call around 7:15- 8:00 am to check on you and address any questions or concerns that you may have regarding the information given to you following your procedure. If we do not reach you, we will leave a message.     If any biopsies were taken you will be contacted by phone or by letter within the next 1-3 weeks.  Please call us  at [phone] if you have not heard about the biopsies in 3 weeks.    SIGNATURES/CONFIDENTIALITY: You and/or your care partner have signed paperwork which will be entered into your electronic medical record.  These signatures attest to the fact that that the information above on your After Visit Summary has been reviewed and is understood.  Full responsibility of the confidentiality of this discharge information lies with you and/or your care-partner.

## 2024-05-26 NOTE — Op Note (Signed)
 [facility] Patient Name: Martin Suarez Procedure Date: 05/26/2024 1:45 PM MRN: [id number] Endoscopist: Lynnie Bring , MD, [id number] Age: 73 Referring MD:  Date of Birth: 06/06/1951 Gender: Male Account #: [id number] Procedure:                Upper GI endoscopy Indications:              Heartburn with regurgitation Medicines:                Monitored Anesthesia Care Procedure:                Pre-Anesthesia Assessment:                           - Prior to the procedure, a History and Physical                            was performed, and patient medications and                            allergies were reviewed. The patient's tolerance of                            previous anesthesia was also reviewed. The risks                            and benefits of the procedure and the sedation                            options and risks were discussed with the patient.                            All questions were answered, and informed consent                            was obtained. Prior Anticoagulants: The patient has                            taken no anticoagulant or antiplatelet agents. ASA                            Grade Assessment: III - A patient with severe                            systemic disease. After reviewing the risks and                            benefits, the patient was deemed in satisfactory                            condition to undergo the procedure.                           After obtaining informed consent, the endoscope was  passed under direct vision. Throughout the                            procedure, the patient's blood pressure, pulse, and                            oxygen saturations were monitored continuously. The                            GIF [id number] #[id number] was introduced through the                            mouth, and advanced to the second part of duodenum.                            The upper GI endoscopy was  accomplished without                            difficulty. The patient tolerated the procedure                            well. Scope In: Scope Out: Findings:                 The esophagus was moderately torturous specially at                            the distal one fourth of the esophagus and                            foreshortened. LA Grade B (one or more mucosal                            breaks greater than 5 mm, not extending between the                            tops of two mucosal folds) esophagitis with no                            bleeding was found 32 to 35 cm from the incisors.                            Biopsies were taken with a cold forceps for                            histology.                           A 5 cm hiatal hernia was present extending from 35                            cm (GE Jn) to 40 cm (diaphragmatic hiatus).  Evidence of a fundoplication was found in the                            cardia. The wrap appeared loose.                           Diffuse moderate inflammation characterized by                            congestion (edema), erosions and erythema was found                            in the gastric antrum. Biopsies were taken with a                            cold forceps for histology.                           The examined duodenum was normal. Complications:            No immediate complications. Estimated Blood Loss:     Estimated blood loss: none. Impression:               - LA Grade B reflux esophagitis with no bleeding.                            Biopsied.                           - 5 cm hiatal hernia.                           - Endoscopic findings consistent with slipped                            fundoplication.                           - Gastritis. Biopsied. Recommendation:           - Patient has a contact number available for                            emergencies. The signs and symptoms of potential                             delayed complications were discussed with the                            patient. Return to normal activities tomorrow.                            Written discharge instructions were provided to the                            patient.                           -  Resume previous diet.                           - Not sure if he is compliant with medications. He                            needs to be on Nexium 40 mg BID (1/2hr before                            breakfast and before supper), and Pepcid  20 mg                            p.o.QHS.                           - Brochures regarding GERD.                           - Await pathology results.                           - Avoid alcohol/sodas/nonsteroidals.                           - Can proceed with [facility] by [facility].                           - The findings and recommendations were discussed                            with the patient. Lynnie Bring, MD 05/26/2024 2:11:48 PM This report has been signed electronically.

## 2024-05-27 ENCOUNTER — Telehealth: Payer: Self-pay | Admitting: *Deleted

## 2024-05-27 NOTE — Telephone Encounter (Signed)
  Follow up Call-     05/26/2024    1:03 PM  Call back number  Post procedure Call Back phone  # [phone]  Permission to leave phone message Yes     Patient questions:  Do you have a fever, pain , or abdominal swelling? No. Pain Score  0 *  Have you tolerated food without any problems? Yes.    Have you been able to return to your normal activities? Yes.    Do you have any questions about your discharge instructions: Diet   No. Medications  No. Follow up visit  No.  Do you have questions or concerns about your Care? No.  Actions: * If pain score is 4 or above: No action needed, pain <4.

## 2024-05-29 LAB — SURGICAL PATHOLOGY

## 2024-06-05 ENCOUNTER — Telehealth: Payer: Self-pay

## 2024-06-05 NOTE — Telephone Encounter (Signed)
 Reviewed procedure report from 05/26/24 with Dr. Charlanne. Pt will need manometry. Will need to send for [facility] form for approval.

## 2024-06-06 ENCOUNTER — Ambulatory Visit: Payer: Self-pay | Admitting: Gastroenterology

## 2024-06-06 NOTE — Telephone Encounter (Signed)
 Just manometry. Does not require pH Please make sure that he is not already scheduled for manometry through [facility]

## 2024-06-11 NOTE — Telephone Encounter (Signed)
 Inbound call from patient stating he would like to know if he were to schedule procedure with us  would his insurance cover or should he have it through [facility] Requesting a call back  Please advise  Thank you

## 2024-06-11 NOTE — Telephone Encounter (Signed)
 Spoke with patient & advised him to get in touch with his GI team at the [facility] and discuss next steps. If they do recommend another procedure, then to just give us  a call & we can discuss with MD regarding scheduling and then discuss insurance authorization further then. Pt verbalized all understanding & plans to call [facility] today for update.

## 2024-06-11 NOTE — Telephone Encounter (Signed)
 Spoke with patient & the [facility] attempted esophageal manometry last week, however were unsuccessful. Pt stated tube kept turning & would not advance to his stomach. [facility] recommended another EGD. He plans to do future procedures through [facility], but will give our office a call if needed.

## 2024-07-22 DIAGNOSIS — E113213 Type 2 diabetes mellitus with mild nonproliferative diabetic retinopathy with macular edema, bilateral: Secondary | ICD-10-CM | POA: Diagnosis not present

## 2024-09-14 ENCOUNTER — Other Ambulatory Visit: Payer: Self-pay | Admitting: Family Medicine

## 2024-09-14 DIAGNOSIS — R413 Other amnesia: Secondary | ICD-10-CM

## 2024-09-15 NOTE — Progress Notes (Unsigned)
 "    09/16/2024 Martin Suarez [id number] 03/05/1951  Referring provider: No ref. provider found Primary GI doctor: Dr. Charlanne  ASSESSMENT AND PLAN:  GERD with regurtiation, recurrence of hiatal hernia Status post open Nissan fundoplication 1986 Slipped Nissen fundoplication 2017 05/26/2024 EGD grade B esophagitis, 5 cm hiatal hernia endoscopic findings consistent with slipped fundoplication and gastritis.  Negative H. pylori Manometry and ph study aborted He is on the nexium 40mg  Bid, pepcid  20 mg BID, reglan 5 mg QID Continuing symptoms of regurgitation intermittent food, some dysphagia, weight loss, gets full quickly I do not believe we will be able to assist at this time, likely symptoms are from large hiatal hernia/gastroparesis and patient needs referral for endoflip PLAN: - will get barium swallow - will get GES - gastroparesis diet given - continue PPI, pepcid , reglan, and added on carafate  - get KUB to evaluate constipation, some help with miralax ?, consider motegrity if we can get it improved or at least linzess - will refer to tertiary center for endoflip and consideration of hiatal hernia repair with general surgery ( appears he has referral to [facility]) - will discuss further with Dr. Charlanne for any other suggestions  Post prandial diarrhea with history of constipation, urgency some urinary urgency last 2 months On Remeron  45 mg - KUB to assess stool burden, if it shows constipation, consider motegrity - Pancreatic elastase, fecal fat, fecal calprotectin - possible pelvic floor component with urinary symptoms -will get CT AB and pelvis with history of weight loss, change in bowel habits/urinary symptoms  Chronic abdominal pain since 1986 09/2023 CTAP W unremarkable Advised to stop marijuana, EtOH  Patient Care Team: Elena Franky LABOR, PA (Inactive) as PCP - General (Family Medicine)  HISTORY OF PRESENT ILLNESS: 74 y.o. male with a past medical history listed  below presents for evaluation of regurgitation in setting of failed nissan fundipliation.   Patient last seen in the office July 2025 by Dr. Charlanne for chronic abdominal pain.  Discussed the use of AI scribe software for clinical note transcription with the patient, who gave verbal consent to proceed.  History of Present Illness   Martin Suarez is a 74 year old male with prior Nissen fundoplication and large hiatal hernia who presents for evaluation of persistent reflux and regurgitation.  Underwent open Nissen fundoplication in 1986, complicated by a slipped wrap in 2017. EGD in October 2025 showed a 5 cm hiatal hernia and gastritis, with negative H. pylori and negative pathology. Eosinophilic esophagitis was not present. Attempts at pH and motility studies were unsuccessful due to inability to intubate. Barium swallow and gastric emptying studies are scheduled at the [facility] in March.  Continues to experience significant reflux and regurgitation while taking Nexium 40 mg twice daily, Pepcid  20 mg twice daily, and Reglan. Bending over almost instantly causes acid to rise into his throat. Frequently regurgitates food, sometimes hours after eating, and describes an episode of vomiting undigested food at night, several hours after a meal. Regurgitation can occur at any time and is not associated with nausea. Sometimes feels that food does not go down easily but does not experience early satiety. Often wakes up with reflux and takes antacids at night. The head of his bed is elevated, but nocturnal reflux persists. Denies use of NSAIDs or prescription pain medications, using only Tylenol . Has not tried Carafate . Takes trazodone 50-100 mg at night for sleep.  Previously experienced unintentional weight loss but has recently increased intake and is now at 200  lbs, which he considers his goal weight. A recent blood test was ordered to check his sugar level.  Previously had chronic constipation but now describes  urgent, explosive bowel movements, typically within 30 minutes after eating and occurring daily. Denies current constipation, significant gas, or bloating. Denies blood in the stool and black, tarry stools, but notes very dark brown stools. Also experiences urinary urgency, which has worsened over the past two months, requiring proximity to a toilet when the urge arises.  Underwent lumbar spine surgery approximately 5-6 years ago, followed by significant pain and difficulty walking for four months, with eventual relief through acupuncture. Currently denies back pain. Identifies as dyslexic and has significant astigmatism, making reading difficult. 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      He  reports that he quit smoking about 56 years ago. His smoking use included cigarettes. He has never used smokeless tobacco. He reports current alcohol use. He reports current drug use. Drug: Marijuana.  RELEVANT GI HISTORY, IMAGING AND LABS: Results   Diagnostic EGD (05/2024): 5 cm hiatal hernia, gastritis, negative for Helicobacter pylori, negative for eosinophilic esophagitis, no gastropathy      CBC    Component Value Date/Time   WBC 9.9 07/04/2020 0939   RBC 4.55 07/04/2020 0939   HGB 14.6 07/04/2020 0939   HCT 42.4 07/04/2020 0939   PLT 309.0 07/04/2020 0939   MCV 93.3 07/04/2020 0939   MCH 31.2 03/12/2020 2110   MCHC 34.4 07/04/2020 0939   RDW 14.9 07/04/2020 0939   LYMPHSABS 1.7 07/04/2020 0939   MONOABS 0.6 07/04/2020 0939   EOSABS 0.3 07/04/2020 0939   BASOSABS 0.1 07/04/2020 0939   No results for input(s): HGB in the last 8760 hours.  CMP     Component Value Date/Time   NA 138 07/04/2020 0939   K 4.2 07/04/2020 0939   CL 102 07/04/2020 0939   CO2 26 07/04/2020 0939   GLUCOSE 140 (H) 07/04/2020 0939   BUN 19 07/04/2020 0939   CREATININE 1.05 07/04/2020 0939   CALCIUM 9.7 07/04/2020 0939   PROT 7.3 07/04/2020 0939   ALBUMIN 4.5 07/04/2020  0939   AST 31 07/04/2020 0939   ALT 31 07/04/2020 0939   ALKPHOS 92 07/04/2020 0939   BILITOT 0.6 07/04/2020 0939   GFRNONAA >60 03/13/2020 0309   GFRAA >60 03/13/2020 0309      Latest Ref Rng & Units 07/04/2020    9:39 AM 03/12/2020    9:10 PM 01/13/2020   12:56 PM  Hepatic Function  Total Protein 6.0 - 8.3 g/dL 7.3  8.1  7.3   Albumin 3.5 - 5.2 g/dL 4.5  4.7  3.9   AST 0 - 37 U/L 31  36  30   ALT 0 - 53 U/L 31  35  61   Alk Phosphatase 39 - 117 U/L 92  91  93   Total Bilirubin 0.2 - 1.2 mg/dL 0.6  0.8  0.8       Current Medications:   Current Outpatient Medications (Cardiovascular):    simvastatin  (ZOCOR ) 40 MG tablet, Take 40 mg by mouth daily.   Current Outpatient Medications (Respiratory):    diphenhydrAMINE (BENADRYL) 25 MG tablet, Take 25-75 mg by mouth every 6 (six) hours as needed for itching or allergies.  Current Outpatient Medications (Analgesics):    ibuprofen (ADVIL) 800 MG tablet, Take 800 mg by mouth every 8 (eight) hours as needed for fever, headache, mild pain,  moderate pain or cramping.  Current Outpatient Medications (Hematological):    folic acid  (FOLVITE ) 1 MG tablet, Take 1 mg by mouth.  Current Outpatient Medications (Other):    Ascorbic Acid (VITA-C PO), Take 1 tablet by mouth daily.   b complex vitamins tablet, Take 1 tablet by mouth daily.   busPIRone (BUSPAR) 15 MG tablet, Take 30 mg by mouth.   Cholecalciferol  (VITAMIN D PO), Take 1 capsule by mouth daily.   cyclobenzaprine (FLEXERIL) 10 MG tablet, Take 10 mg by mouth.   famotidine  (PEPCID ) 20 MG tablet, Take 1 tablet (20 mg total) by mouth at bedtime.   finasteride  (PROSCAR ) 5 MG tablet, Take 5 mg by mouth daily.   FLUoxetine (PROZAC) 20 MG capsule, Take 20-40 mg by mouth See admin instructions. 40 mg in the am and 20 mg in the pm   gabapentin (NEURONTIN) 300 MG capsule, Take 600-900 mg by mouth See admin instructions. 600mg  in he morning and 900mg  at bedtime   Glucosamine-Chondroitin (OSTEO  BI-FLEX REGULAR STRENGTH) 250-200 MG TABS, Take 1 tablet by mouth in the morning and at bedtime.   hydroxypropyl methylcellulose / hypromellose (ISOPTO TEARS / GONIOVISC) 2.5 % ophthalmic solution, Place 1 drop into both eyes 3 (three) times daily as needed for dry eyes.   MAGNESIUM  PO, Take 1 tablet by mouth daily.   Melatonin 10 MG TABS, Take 30 mg by mouth at bedtime.   methocarbamol (ROBAXIN) 750 MG tablet, Take 750 mg by mouth 4 (four) times daily as needed for muscle spasms.   metoCLOPramide (REGLAN) 5 MG tablet, Take 5 mg by mouth 4 (four) times daily.   mirtazapine  (REMERON ) 45 MG tablet, Take 45 mg by mouth at bedtime.   Multiple Vitamins-Minerals (ZINC PO), Take 1 tablet by mouth daily.   omeprazole (PRILOSEC) 20 MG capsule, Take 20 mg by mouth.   oxybutynin (DITROPAN-XL) 10 MG 24 hr tablet, Take 10 mg by mouth daily.   pantoprazole  (PROTONIX ) 40 MG tablet, Take 40 mg by mouth 2 (two) times daily before a meal.    Polyvinyl Alcohol-Povidone PF 1.4-0.6 % SOLN, Apply to eye.   sucralfate  (CARAFATE ) 1 g tablet, Take 1 tablet (1 g total) by mouth 4 (four) times daily -  with meals and at bedtime.  Medical History:  Past Medical History:  Diagnosis Date   Abnormal finding on imaging    Alcoholic hepatitis (HCC)    Anxiety    Chronic abdominal pain    Constipation    Depression    Diverticulosis    Herpes simplex    Hiatal hernia with GERD    High cholesterol    Insomnia    Low testosterone    Major depressive disorder, recurrent, mild    Prostate enlargement    Suicidal ideation    Syncope    Allergies: Allergies[1]   Surgical History:  He  has a past surgical history that includes Nissen fundoplication; Lumbar disc surgery; Tonsillectomy; Colonoscopy (05/25/2021); and Esophagogastroduodenoscopy (05/25/2021). Family History:  His family history includes Breast cancer in his mother; Heart disease in his father; Hypertension in his father and mother; Stomach cancer in his  mother; Stroke in his mother.  REVIEW OF SYSTEMS  : All other systems reviewed and negative except where noted in the History of Present Illness.  PHYSICAL EXAM: BP 116/80   Pulse 91   Ht 6' (1.829 m)   Wt 200 lb (90.7 kg)   BMI 27.12 kg/m  Physical Exam   MEASUREMENTS: Weight- 200.  GENERAL APPEARANCE: Well nourished, in no apparent distress. HEENT: No cervical lymphadenopathy, unremarkable thyroid, sclerae anicteric, conjunctiva pink. RESPIRATORY: Respiratory effort normal, breath sounds equal bilaterally without rales, rhonchi, or wheezing. CARDIO: Regular rate and rhythm with a systolic murmur, peripheral pulses intact. ABDOMEN: Soft, non-distended, active bowel sounds in all four quadrants, no tenderness to palpation, no rebound, no mass appreciated. RECTAL: Declines. MUSCULOSKELETAL: Full range of motion, normal gait, without edema. SKIN: Dry, intact without rashes or lesions. No jaundice. NEURO: Alert, oriented, no focal deficits. PSYCH: Cooperative, normal mood and affect.      Alan JONELLE Coombs, PA-C 10:53 AM      [1]  Allergies Allergen Reactions   Metoprolol Other (See Comments)    Had trouble breathing   Propranolol Hcl Other (See Comments)    Sweating, feeling faint   "

## 2024-09-16 ENCOUNTER — Other Ambulatory Visit: Payer: Medicare (Managed Care)

## 2024-09-16 ENCOUNTER — Ambulatory Visit: Admitting: Physician Assistant

## 2024-09-16 ENCOUNTER — Encounter: Payer: Self-pay | Admitting: Physician Assistant

## 2024-09-16 ENCOUNTER — Ambulatory Visit
Admission: RE | Admit: 2024-09-16 | Discharge: 2024-09-16 | Disposition: A | Source: Ambulatory Visit | Attending: Physician Assistant | Admitting: Physician Assistant

## 2024-09-16 VITALS — BP 116/80 | HR 91 | Ht 72.0 in | Wt 200.0 lb

## 2024-09-16 DIAGNOSIS — K219 Gastro-esophageal reflux disease without esophagitis: Secondary | ICD-10-CM | POA: Diagnosis not present

## 2024-09-16 DIAGNOSIS — Z9889 Other specified postprocedural states: Secondary | ICD-10-CM

## 2024-09-16 DIAGNOSIS — R3915 Urgency of urination: Secondary | ICD-10-CM | POA: Diagnosis not present

## 2024-09-16 DIAGNOSIS — K5900 Constipation, unspecified: Secondary | ICD-10-CM

## 2024-09-16 DIAGNOSIS — R109 Unspecified abdominal pain: Secondary | ICD-10-CM

## 2024-09-16 DIAGNOSIS — R197 Diarrhea, unspecified: Secondary | ICD-10-CM

## 2024-09-16 DIAGNOSIS — R1310 Dysphagia, unspecified: Secondary | ICD-10-CM

## 2024-09-16 DIAGNOSIS — K449 Diaphragmatic hernia without obstruction or gangrene: Secondary | ICD-10-CM

## 2024-09-16 DIAGNOSIS — G8929 Other chronic pain: Secondary | ICD-10-CM

## 2024-09-16 DIAGNOSIS — R1110 Vomiting, unspecified: Secondary | ICD-10-CM

## 2024-09-16 LAB — CBC WITH DIFFERENTIAL/PLATELET
Basophils Absolute: 0.1 10*3/uL (ref 0.0–0.1)
Basophils Relative: 1.3 % (ref 0.0–3.0)
Eosinophils Absolute: 0.3 10*3/uL (ref 0.0–0.7)
Eosinophils Relative: 3.2 % (ref 0.0–5.0)
HCT: 40.5 % (ref 39.0–52.0)
Hemoglobin: 14 g/dL (ref 13.0–17.0)
Lymphocytes Relative: 36.4 % (ref 12.0–46.0)
Lymphs Abs: 3 10*3/uL (ref 0.7–4.0)
MCHC: 34.5 g/dL (ref 30.0–36.0)
MCV: 94.9 fl (ref 78.0–100.0)
Monocytes Absolute: 0.7 10*3/uL (ref 0.1–1.0)
Monocytes Relative: 8.9 % (ref 3.0–12.0)
Neutro Abs: 4.1 10*3/uL (ref 1.4–7.7)
Neutrophils Relative %: 50.2 % (ref 43.0–77.0)
Platelets: 311 10*3/uL (ref 150.0–400.0)
RBC: 4.27 Mil/uL (ref 4.22–5.81)
RDW: 13 % (ref 11.5–15.5)
WBC: 8.1 10*3/uL (ref 4.0–10.5)

## 2024-09-16 LAB — COMPREHENSIVE METABOLIC PANEL WITH GFR
ALT: 21 U/L (ref 3–53)
AST: 29 U/L (ref 5–37)
Albumin: 4.5 g/dL (ref 3.5–5.2)
Alkaline Phosphatase: 85 U/L (ref 39–117)
BUN: 17 mg/dL (ref 6–23)
CO2: 25 meq/L (ref 19–32)
Calcium: 9.7 mg/dL (ref 8.4–10.5)
Chloride: 105 meq/L (ref 96–112)
Creatinine, Ser: 1.23 mg/dL (ref 0.40–1.50)
GFR: 57.59 mL/min — ABNORMAL LOW
Glucose, Bld: 88 mg/dL (ref 70–99)
Potassium: 4.3 meq/L (ref 3.5–5.1)
Sodium: 140 meq/L (ref 135–145)
Total Bilirubin: 0.5 mg/dL (ref 0.2–1.2)
Total Protein: 6.9 g/dL (ref 6.0–8.3)

## 2024-09-16 LAB — SEDIMENTATION RATE: Sed Rate: 18 mm/h (ref 0–20)

## 2024-09-16 MED ORDER — SUCRALFATE 1 G PO TABS: 1.0000 g | ORAL_TABLET | Freq: Three times a day (TID) | ORAL | 0 refills | Status: AC

## 2024-09-16 NOTE — Patient Instructions (Addendum)
 Your provider has requested that you have an abdominal x ray before leaving today. Please go to the basement floor to our Radiology department for the test.  Your provider has requested that you go to the basement level for lab work before leaving today. Press B on the elevator. The lab is located at the first door on the left as you exit the elevator.  You have been scheduled for a gastric emptying scan at [facility] on 09/24/24 at 7:30am. Please arrive at least 30 minutes prior to your appointment for registration. Please make certain not to have anything to eat or drink after midnight the night before your test. Hold all stomach medications (ex: Zofran , phenergan, Reglan) 24 hours prior to your test. If you need to reschedule your appointment, please contact radiology scheduling at [phone]. _____________________________________________________________________ A gastric-emptying study measures how long it takes for food to move through your stomach. There are several ways to measure stomach emptying. In the most common test, you eat food that contains a small amount of radioactive material. A scanner that detects the movement of the radioactive material is placed over your abdomen to monitor the rate at which food leaves your stomach. This test normally takes about 4 hours to complete. _____________________________________________________________________  Martin Suarez have been scheduled for a CT scan of the abdomen and pelvis at [facility], 1st floor Radiology. You are scheduled on 09/25/24 at 2:00pm. You should arrive 30 minutes prior to your appointment time for registration.    The purpose of you drinking the oral contrast is to aid in the visualization of your intestinal tract. The contrast solution may cause some diarrhea. Depending on your individual set of symptoms, you may also receive an intravenous injection of x-ray contrast/dye. Plan on being at [facility] for 45 minutes  or longer, depending on the type of exam you are having performed.   If you have any questions regarding your exam or if you need to reschedule, you may call [facility] at [phone] between the hours of 8:00 am and 5:00 pm, Monday-Friday.   You have been scheduled for a Barium Esophogram at [facility] (1st floor of the hospital) on 09/17/24 at 10:00am. Please arrive 30 minutes prior to your appointment for registration. Make certain not to have anything to eat or drink 3 hours prior to your test. If you need to reschedule for any reason, please contact radiology at [phone] to do so. __________________________________________________________________ A barium swallow is an examination that concentrates on views of the esophagus. This tends to be a double contrast exam (barium and two liquids which, when combined, create a gas to distend the wall of the oesophagus) or single contrast (non-ionic iodine based). The study is usually tailored to your symptoms so a good history is essential. Attention is paid during the study to the form, structure and configuration of the esophagus, looking for functional disorders (such as aspiration, dysphagia, achalasia, motility and reflux) EXAMINATION You may be asked to change into a gown, depending on the type of swallow being performed. A radiologist and radiographer will perform the procedure. The radiologist will advise you of the type of contrast selected for your procedure and direct you during the exam. You will be asked to stand, sit or lie in several different positions and to hold a small amount of fluid in your mouth before being asked to swallow while the imaging is performed .In some instances you may be asked to swallow barium coated marshmallows  to assess the motility of a solid food bolus. The exam can be recorded as a digital or video fluoroscopy procedure. POST PROCEDURE It will take 1-2 days for the barium to pass through  your system. To facilitate this, it is important, unless otherwise directed, to increase your fluids for the next 24-48hrs and to resume your normal diet.  This test typically takes about 30 minutes to perform. __________________________________________________________________________________   Can check out [facility] for patient's  VISIT SUMMARY:  During your visit, we discussed your persistent reflux and regurgitation issues despite current medications. We also addressed your dysphagia, chronic constipation with explosive diarrhea, and recent unintentional weight loss.  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 -Continue taking Nexium 40 mg twice daily, Pepcid  20 mg twice daily, and Reglan as previously prescribed. -Use Miralax  (polyethylene glycol) twice daily. -Add Carafate , especially at night. -Follow the gastroparesis diet provided. -You are referred to a tertiary center [facility] or [place]) for an endoflip procedure. -A barium swallow and gastric emptying study have been ordered.  DYSPHAGIA AND REGURGITATION: You have chronic regurgitation and intermittent difficulty swallowing, which may be due to motility or structural issues. -You are referred to a tertiary center for an endoflip procedure. -A barium swallow has been ordered.  CHRONIC CONSTIPATION WITH EXPLOSIVE DIARRHEA: You have chronic constipation with frequent, urgent, explosive diarrhea, which may be due to pelvic floor dysfunction, medication effects, or overflow diarrhea. -An abdominal x-ray has been ordered. -Continue taking Miralax  as previously prescribed.  UNINTENTIONAL WEIGHT LOSS: Your recent unintentional weight loss may be due to your hiatal hernia, gastroparesis, or other gastrointestinal issues. -A CT scan of your abdomen and pelvis has been ordered. -Follow the  gastroparesis diet provided. -Laboratory studies have been ordered.  Please take your proton pump inhibitor medication, nexium TWICE a day Please take this medication 30 minutes to 1 hour before meals- this makes it more effective.  Avoid spicy and acidic foods Avoid fatty foods Limit your intake of coffee, tea, alcohol, and carbonated drinks Work to maintain a healthy weight Keep the head of the bed elevated at least 3 inches with blocks or a wedge pillow if you are having any nighttime symptoms Stay upright for 2 hours after eating Avoid meals and snacks three to four hours before bedtime  Gastroparesis Gastroparesis is a condition in which food takes longer than normal to empty from the stomach.  This condition is also known as delayed gastric emptying. It is usually a long-term (chronic) condition.  What are the signs or symptoms? Symptoms of this condition include: Feeling full after eating very little or a loss of appetite. Nausea, vomiting, or heartburn. Bloating of your abdomen. Inconsistent blood sugar (glucose) levels on blood tests. Unexplained weight loss. Acid from the stomach coming up into the esophagus (gastroesophageal reflux). Sudden tightening (spasm) of the stomach, which can be painful. Symptoms may come and go. Some people may not notice any symptoms.  What increases the risk? You are more likely to develop this condition if: You have certain disorders or diseases. These may include: An endocrine disorder. An eating disorder. Amyloidosis. Scleroderma. Parkinson's disease. Multiple sclerosis. Cancer or infection of the stomach or the vagus nerve. You have had surgery on your stomach or vagus nerve. You take certain medicines. You are male.  Things you can do: Please do small frequent meals like 4-6 meals a day.  Eat  and drink liquids at separate times.  Avoid high fiber foods, cook your vegetables, avoid high fat food.  Suggest spreading protein  throughout the day (greek yogurt, glucerna, soft meat, milk, eggs) Choose soft foods that you can mash with a fork When you are more symptomatic, change to pureed foods foods and liquids.  Consider reading Living well with Gastroparesis by [facility] Check out this link to a diet online https://my.groupjournal.fr  _______________________________________________________  If your blood pressure at your visit was 140/90 or greater, please contact your primary care physician to follow up on this.  _______________________________________________________  If you are age 68 or older, your body mass index should be between 23-30. Your Body mass index is 27.12 kg/m. If this is out of the aforementioned range listed, please consider follow up with your Primary Care Provider.  If you are age 69 or younger, your body mass index should be between 19-25. Your Body mass index is 27.12 kg/m. If this is out of the aformentioned range listed, please consider follow up with your Primary Care Provider.   ________________________________________________________  The [facility] providers would like to encourage you to use MYCHART to communicate with providers for non-urgent requests or questions.  Due to long hold times on the telephone, sending your provider a message by [facility] may be a faster and more efficient way to get a response.  Please allow 48 business hours for a response.  Please remember that this is for non-urgent requests.  _______________________________________________________  [facility] is using a team-based approach to care.  Your team is made up of your doctor and two to three APPS. Our APPS (Nurse Practitioners and Physician Assistants) work with your physician to ensure care continuity for you. They are fully qualified to address your health concerns and develop a treatment plan.  They communicate directly with your gastroenterologist to care for you. Seeing the Advanced Practice Practitioners on your physician's team can help you by facilitating care more promptly, often allowing for earlier appointments, access to diagnostic testing, procedures, and other specialty referrals.   Thank you for trusting me with your gastrointestinal care. Alan Coombs, PA-C

## 2024-09-17 ENCOUNTER — Ambulatory Visit: Payer: Self-pay | Admitting: Physician Assistant

## 2024-09-17 ENCOUNTER — Telehealth: Payer: Self-pay

## 2024-09-17 ENCOUNTER — Other Ambulatory Visit: Payer: Self-pay

## 2024-09-17 ENCOUNTER — Ambulatory Visit (HOSPITAL_COMMUNITY)
Admission: RE | Admit: 2024-09-17 | Discharge: 2024-09-17 | Attending: Physician Assistant | Admitting: Physician Assistant

## 2024-09-17 DIAGNOSIS — K449 Diaphragmatic hernia without obstruction or gangrene: Secondary | ICD-10-CM

## 2024-09-17 DIAGNOSIS — G8929 Other chronic pain: Secondary | ICD-10-CM

## 2024-09-17 DIAGNOSIS — K219 Gastro-esophageal reflux disease without esophagitis: Secondary | ICD-10-CM

## 2024-09-17 DIAGNOSIS — R1310 Dysphagia, unspecified: Secondary | ICD-10-CM

## 2024-09-17 DIAGNOSIS — Z9889 Other specified postprocedural states: Secondary | ICD-10-CM

## 2024-09-17 DIAGNOSIS — R1110 Vomiting, unspecified: Secondary | ICD-10-CM

## 2024-09-17 NOTE — Telephone Encounter (Signed)
 Rec'd fax from [facility] We do not accept this patient's insurance. Please refer elsewhere

## 2024-09-24 ENCOUNTER — Other Ambulatory Visit (HOSPITAL_COMMUNITY)

## 2024-09-25 ENCOUNTER — Ambulatory Visit (HOSPITAL_COMMUNITY)

## 2024-10-01 ENCOUNTER — Other Ambulatory Visit

## 2025-01-26 ENCOUNTER — Ambulatory Visit: Payer: Medicare (Managed Care) | Admitting: Neurology
